# Patient Record
Sex: Male | Born: 1985 | Race: Black or African American | Hispanic: No | Marital: Single | Smoking: Current every day smoker
Health system: Southern US, Community
[De-identification: ages and names within clinical notes are randomized; demographics above are authoritative.]

## PROBLEM LIST (undated history)

## (undated) DIAGNOSIS — J45909 Unspecified asthma, uncomplicated: Secondary | ICD-10-CM

---

## 2012-12-22 ENCOUNTER — Emergency Department (HOSPITAL_COMMUNITY)
Admission: EM | Admit: 2012-12-22 | Discharge: 2012-12-22 | Disposition: A | Discharge status: Home / Self Care | Payer: BC Managed Care – PPO | Attending: Emergency Medicine | Admitting: Emergency Medicine

## 2012-12-22 ENCOUNTER — Emergency Department (HOSPITAL_COMMUNITY): Payer: BC Managed Care – PPO

## 2012-12-22 ENCOUNTER — Encounter (HOSPITAL_COMMUNITY): Payer: Self-pay | Admitting: Adult Health

## 2012-12-22 DIAGNOSIS — Y990 Civilian activity done for income or pay: Secondary | ICD-10-CM | POA: Insufficient documentation

## 2012-12-22 DIAGNOSIS — S298XXA Other specified injuries of thorax, initial encounter: Secondary | ICD-10-CM | POA: Insufficient documentation

## 2012-12-22 DIAGNOSIS — R0789 Other chest pain: Secondary | ICD-10-CM

## 2012-12-22 DIAGNOSIS — J45909 Unspecified asthma, uncomplicated: Secondary | ICD-10-CM | POA: Insufficient documentation

## 2012-12-22 DIAGNOSIS — Y9389 Activity, other specified: Secondary | ICD-10-CM | POA: Insufficient documentation

## 2012-12-22 DIAGNOSIS — Y9289 Other specified places as the place of occurrence of the external cause: Secondary | ICD-10-CM | POA: Insufficient documentation

## 2012-12-22 DIAGNOSIS — F17200 Nicotine dependence, unspecified, uncomplicated: Secondary | ICD-10-CM | POA: Insufficient documentation

## 2012-12-22 DIAGNOSIS — X503XXA Overexertion from repetitive movements, initial encounter: Secondary | ICD-10-CM | POA: Insufficient documentation

## 2012-12-22 HISTORY — DX: Unspecified asthma, uncomplicated: J45.909

## 2012-12-22 LAB — BASIC METABOLIC PANEL
CO2: 25 mEq/L (ref 19–32)
Calcium: 9.5 mg/dL (ref 8.4–10.5)
Creatinine, Ser: 1.02 mg/dL (ref 0.50–1.35)
GFR calc Af Amer: >90 mL/min (ref >90)
GFR calc non Af Amer: >90 mL/min (ref >90)
Sodium: 141 mEq/L (ref 135–145)

## 2012-12-22 LAB — POCT I-STAT TROPONIN I: Troponin i, poc: 0 ng/mL (ref 0.00–0.08)

## 2012-12-22 LAB — CBC
MCH: 30.2 pg (ref 26.0–34.0)
Platelets: 191 10*3/uL (ref 150–400)
RBC: 4.63 MIL/uL (ref 4.22–5.81)
RDW: 12.8 % (ref 11.5–15.5)

## 2012-12-22 MED ORDER — HYDROCODONE-ACETAMINOPHEN 5-325 MG PO TABS: 1.0000 | ORAL_TABLET | Freq: Four times a day (QID) | ORAL | Status: DC | PRN

## 2012-12-22 MED ORDER — HYDROCODONE-ACETAMINOPHEN 5-325 MG PO TABS
1.0000 | ORAL_TABLET | Freq: Once | ORAL | Status: AC
  Administered 2012-12-22: 1 via ORAL
  Filled 2012-12-22: qty 1

## 2012-12-22 MED ORDER — KETOROLAC TROMETHAMINE 60 MG/2ML IM SOLN
60.0000 mg | Freq: Once | INTRAMUSCULAR | Status: AC
  Administered 2012-12-22: 60 mg via INTRAMUSCULAR
  Filled 2012-12-22: qty 2

## 2012-12-22 MED ORDER — IBUPROFEN 800 MG PO TABS: 800.0000 mg | ORAL_TABLET | Freq: Three times a day (TID) | ORAL | Status: DC | PRN

## 2012-12-22 NOTE — ED Notes (Signed)
Pt dc to home.   Pt states understanding to dc instructions.  Pt ambulatory to exit without difficulty.  Pt denies need for w/c. 

## 2012-12-22 NOTE — ED Notes (Addendum)
Presents with sternal chest pain that began Monday night while working at [employer], Chest pain is described as tightness and worse with deep inspiration and touch. Lying down makes pain better, exertion and movement makes pain worse. Pt lifts weights 4 times per week. Denies nausea, denies dizziness. Reports weakness with Chest pain.  Bilateral breath sounds clear. Father died of MI at 3.

## 2012-12-22 NOTE — ED Provider Notes (Signed)
History     CSN: [id number]  Arrival date & time 12/22/12  1719   First MD Initiated Contact with Patient 12/22/12 1827      Chief Complaint  Patient presents with  . Chest Pain    (Consider location/radiation/quality/duration/timing/severity/associated sxs/prior treatment) HPI [name] [name] is a 27 year old male with a past history of asthma who presents to the ED for chest pain. The pain began Monday evening while he was at work. The pain is sharp and started in his right chest upon lifting a keg. He reports the pain is constant and at worse is 8/10. It radiates to his left shoulder blade. He took a Tylenol, which provided little relief. He did not work yesterday or today due to the pain. He states the pain is now also substernal and along the left sternal border since this morning. He denies shoulder pain. He states the pain is worse on exertion, with deep inspiration, and has had some DOE today. He denies palpitations, diaphoresis, and SOB at rest. He denies recent travel, leg pain or edema in his extremitites. He reports working out and lifting 4x/week. He states he does cleans, presses, and other movements that involve the chest and shoulders. He reports he did workout Monday morning and that this pain feels similar to what he had in the past when he had a chest muscle strain. He denies any direct trauma to the chest and shoulders.  Past Medical History  Diagnosis Date  . Asthma     History reviewed. No pertinent past surgical history.  History reviewed. No pertinent family history.  History  Substance Use Topics  . Smoking status: Current Every Day Smoker    Types: Cigarettes  . Smokeless tobacco: Not on file  . Alcohol Use: No      Review of Systems All other systems negative except as documented in the HPI. All pertinent positives and negatives as reviewed in the HPI.  Allergies  Review of patient's allergies indicates no known allergies.  Home Medications    Current Outpatient Rx  Name  Route  Sig  Dispense  Refill  . acetaminophen (TYLENOL) 500 MG tablet   Oral   Take 1,000 mg by mouth every 6 (six) hours as needed for pain.           BP 141/120  Pulse 80  Temp(Src) 98.3 F (36.8 C) (Oral)  Resp 16  SpO2 98%  Physical Exam  Constitutional: He appears well-developed and well-nourished.  HENT:  Head: Normocephalic and atraumatic.  Neck: Normal range of motion. Neck supple.  Cardiovascular: Normal rate, regular rhythm and normal heart sounds.   Pulmonary/Chest: Effort normal and breath sounds normal.  Musculoskeletal:       Right shoulder: He exhibits normal range of motion.       Left shoulder: He exhibits normal range of motion.       Arms:   ED Course  Procedures (including critical care time)  Labs Reviewed  BASIC METABOLIC PANEL - Abnormal; Notable for the following:    Glucose, Bld 122 (*)    All other components within normal limits  CBC  POCT I-STAT TROPONIN I   Dg Chest 2 View  12/22/2012  *RADIOLOGY REPORT*  Clinical Data: Chest pain.  Smoker.  CHEST - 2 VIEW  Comparison: None.  Findings: Lungs are clear.  Heart size is normal.  No pneumothorax or pleural fluid.  IMPRESSION: Negative chest.   Original Report Authenticated By: Holley Dexter, M.D.  The patient has a chest wall injury from lifting the keg at work. The patient will be treated for this. The patient is PERC negative. He has no cardiac risk factors other than smoking. The patient is advised of his BP. Told to follow up with a primary doctor. Told to return here as needed.   MDM  MDM Reviewed: vitals and nursing note Interpretation: labs, ECG and x-ray    Date: 12/22/2012  Rate: 75  Rhythm: normal sinus rhythm  QRS Axis: normal  Intervals: normal  ST/T Wave abnormalities: normal  Conduction Disutrbances:none  Narrative Interpretation:   Old EKG Reviewed: none available           Carlyle Dolly, PA-C 12/22/12 1959

## 2012-12-23 NOTE — ED Provider Notes (Signed)
Medical screening examination/treatment/procedure(s) were performed by non-physician practitioner and as supervising physician I was immediately available for consultation/collaboration.   Joya Gaskins, MD 12/23/12 [phone]

## 2014-03-23 ENCOUNTER — Emergency Department (HOSPITAL_COMMUNITY)
Admission: EM | Admit: 2014-03-23 | Discharge: 2014-03-24 | Disposition: A | Discharge status: Home / Self Care | Payer: BC Managed Care – PPO | Attending: Emergency Medicine | Admitting: Emergency Medicine

## 2014-03-23 DIAGNOSIS — Z791 Long term (current) use of non-steroidal anti-inflammatories (NSAID): Secondary | ICD-10-CM | POA: Insufficient documentation

## 2014-03-23 DIAGNOSIS — F17200 Nicotine dependence, unspecified, uncomplicated: Secondary | ICD-10-CM | POA: Insufficient documentation

## 2014-03-23 DIAGNOSIS — Z79899 Other long term (current) drug therapy: Secondary | ICD-10-CM | POA: Insufficient documentation

## 2014-03-23 DIAGNOSIS — J45909 Unspecified asthma, uncomplicated: Secondary | ICD-10-CM | POA: Insufficient documentation

## 2014-03-23 DIAGNOSIS — L259 Unspecified contact dermatitis, unspecified cause: Secondary | ICD-10-CM | POA: Insufficient documentation

## 2014-03-24 ENCOUNTER — Encounter (HOSPITAL_COMMUNITY): Payer: Self-pay | Admitting: Emergency Medicine

## 2014-03-24 MED ORDER — DEXAMETHASONE SODIUM PHOSPHATE 10 MG/ML IJ SOLN
10.0000 mg | Freq: Once | INTRAMUSCULAR | Status: AC
  Administered 2014-03-24: 10 mg via INTRAMUSCULAR
  Filled 2014-03-24: qty 1

## 2014-03-24 MED ORDER — PERMETHRIN 5 % EX CREA: TOPICAL_CREAM | CUTANEOUS | Status: DC

## 2014-03-24 MED ORDER — HYDROXYZINE HCL 25 MG PO TABS: 25.0000 mg | ORAL_TABLET | Freq: Four times a day (QID) | ORAL | Status: DC

## 2014-03-24 MED ORDER — DIPHENHYDRAMINE HCL 25 MG PO TABS: 25.0000 mg | ORAL_TABLET | Freq: Four times a day (QID) | ORAL | Status: DC | PRN

## 2014-03-24 NOTE — ED Notes (Signed)
Patient with rash to his lower legs and arms.  He has tried cortisone w/o relief. His daughter has same sx.  No one else at home has rash.  No new exposures/soaps.

## 2014-03-24 NOTE — Progress Notes (Signed)
  CARE MANAGEMENT ED NOTE 03/24/2014  Patient:  [name] [name]   Account Number:  [id number]  Date Initiated:  03/24/2014  Documentation initiated by:  Ferdinand CavaSCHETTINO,ANDREA  Subjective/Objective Assessment:   28  yo male contacted this CM due to inability to afford discharge prescriptions     Subjective/Objective Assessment Detail:     Action/Plan:   Patient will fill discharge prescription with MATCH assist   Action/Plan Detail:   Anticipated DC Date:       Status Recommendation to Physician:   Result of Recommendation:  Agreed    DC Planning Services  [facility]    Choice offered to / List presented to:            Status of service:  Completed, signed off  ED Comments:   ED Comments Detail:  This CM received phone call from the patient stated [facility] that the permethrin cream will be $76.00. He stated that he did have BCBS last year through the affordable care act but he was unable to afford the premium and had to drop the coverage, therefore he is currently uninsured. This CM then discussed [facility], that it would reduce the copay to $3 but can only be used once in a year. The patient stated that [facility] because he will not be able to afford the medication otherwise. This CM then entered the information in the system and faxed the [facility] to the [facility], [facility] [address]. Received confirmation that fax received.

## 2014-03-24 NOTE — Discharge Instructions (Signed)
Your treatment today is covering you for both scabies and poison ivy/poison oak. Recommend you take Benadryl as needed for itching. Take Atarax as prescribed as well. For poison ivy/oak, recommend Zanfel wash. Also refrain from itching as this may spread the rash to other areas. For scabies coverage, use permethrin as prescribed. Repeat use in one week if symptoms persist.  Scabies Scabies are small bugs (mites) that burrow under the skin and cause red bumps and severe itching. These bugs can only be seen with a microscope. Scabies are highly contagious. They can spread easily from person to person by direct contact. They are also spread through sharing clothing or linens that have the scabies mites living in them. It is not unusual for an entire family to become infected through shared towels, clothing, or bedding.  HOME CARE INSTRUCTIONS   Your caregiver may prescribe a cream or lotion to kill the mites. If cream is prescribed, massage the cream into the entire body from the neck to the bottom of both feet. Also massage the cream into the scalp and face if your child is less than 26 year old. Avoid the eyes and mouth. Do not wash your hands after application.  Leave the cream on for 8 to 12 hours. Your child should bathe or shower after the 8 to 12 hour application period. Sometimes it is helpful to apply the cream to your child right before bedtime.  One treatment is usually effective and will eliminate approximately 95% of infestations. For severe cases, your caregiver may decide to repeat the treatment in 1 week. Everyone in your household should be treated with one application of the cream.  New rashes or burrows should not appear within 24 to 48 hours after successful treatment. However, the itching and rash may last for 2 to 4 weeks after successful treatment. Your caregiver may prescribe a medicine to help with the itching or to help the rash go away more quickly.  Scabies can live on clothing  or linens for up to 3 days. All of your child's recently used clothing, towels, stuffed toys, and bed linens should be washed in hot water and then dried in a dryer for at least 20 minutes on high heat. Items that cannot be washed should be enclosed in a plastic bag for at least 3 days.  To help relieve itching, bathe your child in a cool bath or apply cool washcloths to the affected areas.  Your child may return to school after treatment with the prescribed cream. SEEK MEDICAL CARE IF:   The itching persists longer than 4 weeks after treatment.  The rash spreads or becomes infected. Signs of infection include red blisters or yellow-tan crust. Document Released: 09/15/2005 Document Revised: 12/08/2011 Document Reviewed: 01/24/2009 [facility] Patient Information 2015 [place], [place]. This information is not intended to replace advice given to you by your health care provider. Make sure you discuss any questions you have with your health care provider.

## 2014-03-31 NOTE — ED Provider Notes (Signed)
CSN: [id number]     Arrival date & time 03/23/14  2320 History   First MD Initiated Contact with Patient 03/23/14 2350     Chief Complaint  Patient presents with  . Rash    (Consider location/radiation/quality/duration/timing/severity/associated sxs/prior Treatment) HPI Comments: Patient is a 28 year old male with no significant past medical history who presents to the emergency department for a rash x2 days. Patient states that rash began on his lower legs and has begun to spread to his bilateral arms. Rash intermittently weeps serous fluid. Patient has tried some topical hydrocortisone without relief. Rash is itching and uncomfortable. He denies any new or recent travel, new ingestions, new soaps/lotions, or pet contact. Patient states daughter is the only one at home with similar symptoms. No associated fever, lip swelling, tongue swelling, trouble swallowing, SOB, cough, N/V, abdominal pain, or weakness.  Patient is a 28 y.o. male presenting with rash. The history is provided by the patient. No language interpreter was used.  Rash   Past Medical History  Diagnosis Date  . Asthma    History reviewed. No pertinent past surgical history. No family history on file. History  Substance Use Topics  . Smoking status: Current Every Day Smoker    Types: Cigarettes  . Smokeless tobacco: Not on file  . Alcohol Use: No    Review of Systems  Skin: Positive for rash.  All other systems reviewed and are negative.    Allergies  Review of patient's allergies indicates no known allergies.  Home Medications   Prior to Admission medications   Medication Sig Start Date End Date Taking? Authorizing Provider  acetaminophen (TYLENOL) 500 MG tablet Take 1,000 mg by mouth every 6 (six) hours as needed for pain.    Historical Provider, MD  diphenhydrAMINE (BENADRYL) 25 MG tablet Take 1 tablet (25 mg total) by mouth every 6 (six) hours as needed for itching (Rash). 03/24/14   Antonietta Breach, PA-C   HYDROcodone-acetaminophen (NORCO/VICODIN) 5-325 MG per tablet Take 1 tablet by mouth every 6 (six) hours as needed for pain. 12/22/12   Resa Miner Lawyer, PA-C  hydrOXYzine (ATARAX/VISTARIL) 25 MG tablet Take 1 tablet (25 mg total) by mouth every 6 (six) hours. 03/24/14   Antonietta Breach, PA-C  ibuprofen (ADVIL,MOTRIN) 800 MG tablet Take 1 tablet (800 mg total) by mouth every 8 (eight) hours as needed for pain. 12/22/12   Brent General, PA-C  permethrin (ELIMITE) 5 % cream Apply to entire body other than face - let sit for 14 hours then wash off, may repeat in 1 week if still having symptoms 03/24/14   Antonietta Breach, PA-C   BP 122/72  Pulse 95  Temp(Src) 98.4 F (36.9 C) (Oral)  Resp 20  Wt 209 lb (94.802 kg)  SpO2 95%  Physical Exam  Nursing note and vitals reviewed. Constitutional: He is oriented to person, place, and time. He appears well-developed and well-nourished. No distress.  Nontoxic/nonseptic appearing  HENT:  Head: Normocephalic and atraumatic.  Mouth/Throat: Oropharynx is clear and moist. No oropharyngeal exudate.  Oropharynx clear. Patient tolerating secretions without difficulty or drooling. No oral lesions.  Eyes: Conjunctivae and EOM are normal. No scleral icterus.  Neck: Normal range of motion.  No nuchal rigidity or meningismus  Pulmonary/Chest: Effort normal. No respiratory distress. He has no wheezes.  Musculoskeletal: Normal range of motion.  Neurological: He is alert and oriented to person, place, and time.  GCS 15. Patient moves extremities without ataxia.  Skin: Skin is warm and dry.  Rash noted. He is not diaphoretic. No erythema. No pallor.  Rash appreciated to b/l limbs, but primarily on b/l lower extremities. Rash pruritic, planar and mildly erythematous with scattered vesicles. No active weeping. No bullae. No skin peeling. Negative Nikolsky sign.  Psychiatric: He has a normal mood and affect. His behavior is normal.    ED Course  Procedures (including  critical care time) Labs Review Labs Reviewed - No data to display  Imaging Review No results found.   EKG Interpretation None      MDM   Final diagnoses:  Contact dermatitis    28 year old male presents for rash to bilateral limbs, primarily lower extremities. Daughter with same rash x2 days. No angioedema or evidence of airway compromise. Patient hemodynamically stable and afebrile. Rash not concerning for SJS, erythema multiforme major, or erythema multiforme minor. Negative Nikolsky sign.  Rash concerning for scabies, however, also resembles contact dermatitis possibly of plant origin. Patient treated in ED with Decadron. He will be discharged with Elimite as well as Hydroxyzine and Benadryl for symptom management. Zanfel also recommended for coverage of poison oak/ivy. PCP followup advised and return precautions discussed. Patient agreeable to plan with no unaddressed concerns.   Filed Vitals:   03/23/14 2357  BP: 122/72  Pulse: 95  Temp: 98.4 F (36.9 C)  TempSrc: Oral  Resp: 20  Weight: 209 lb (94.802 kg)  SpO2: 95%       Antonietta Breach, PA-C 03/31/14 2103

## 2014-04-01 NOTE — ED Provider Notes (Signed)
Medical screening examination/treatment/procedure(s) were performed by non-physician practitioner and as supervising physician I was immediately available for consultation/collaboration.   Shanna CiscoMegan E Docherty, MD 04/01/14 0000

## 2014-04-24 IMAGING — CR DG CHEST 2V
2 series · 2 of 2 positions shown · non-contrast
Comparison: None.

CLINICAL DATA: Chest pain.  Smoker.

CHEST - 2 VIEW

[w chest pa]
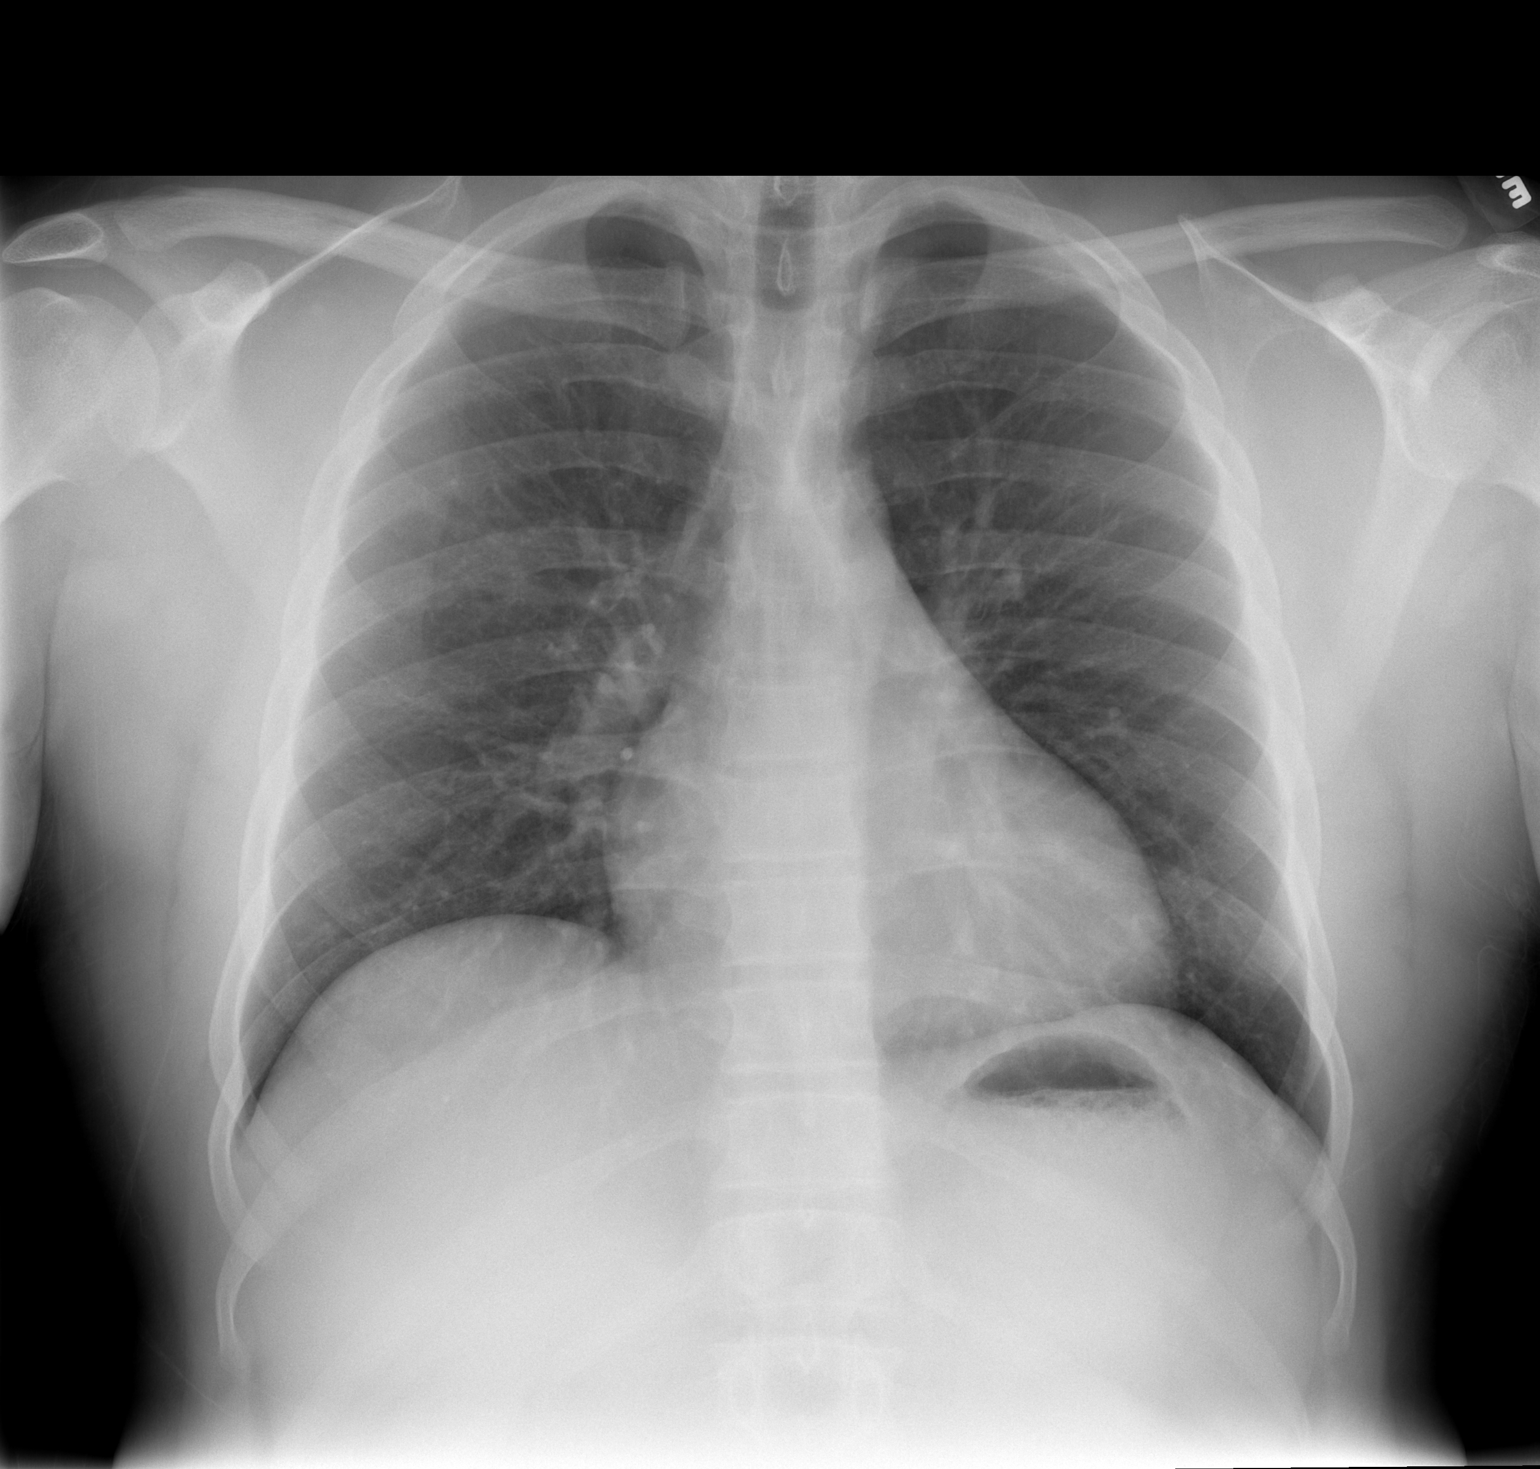

[w chest lat]
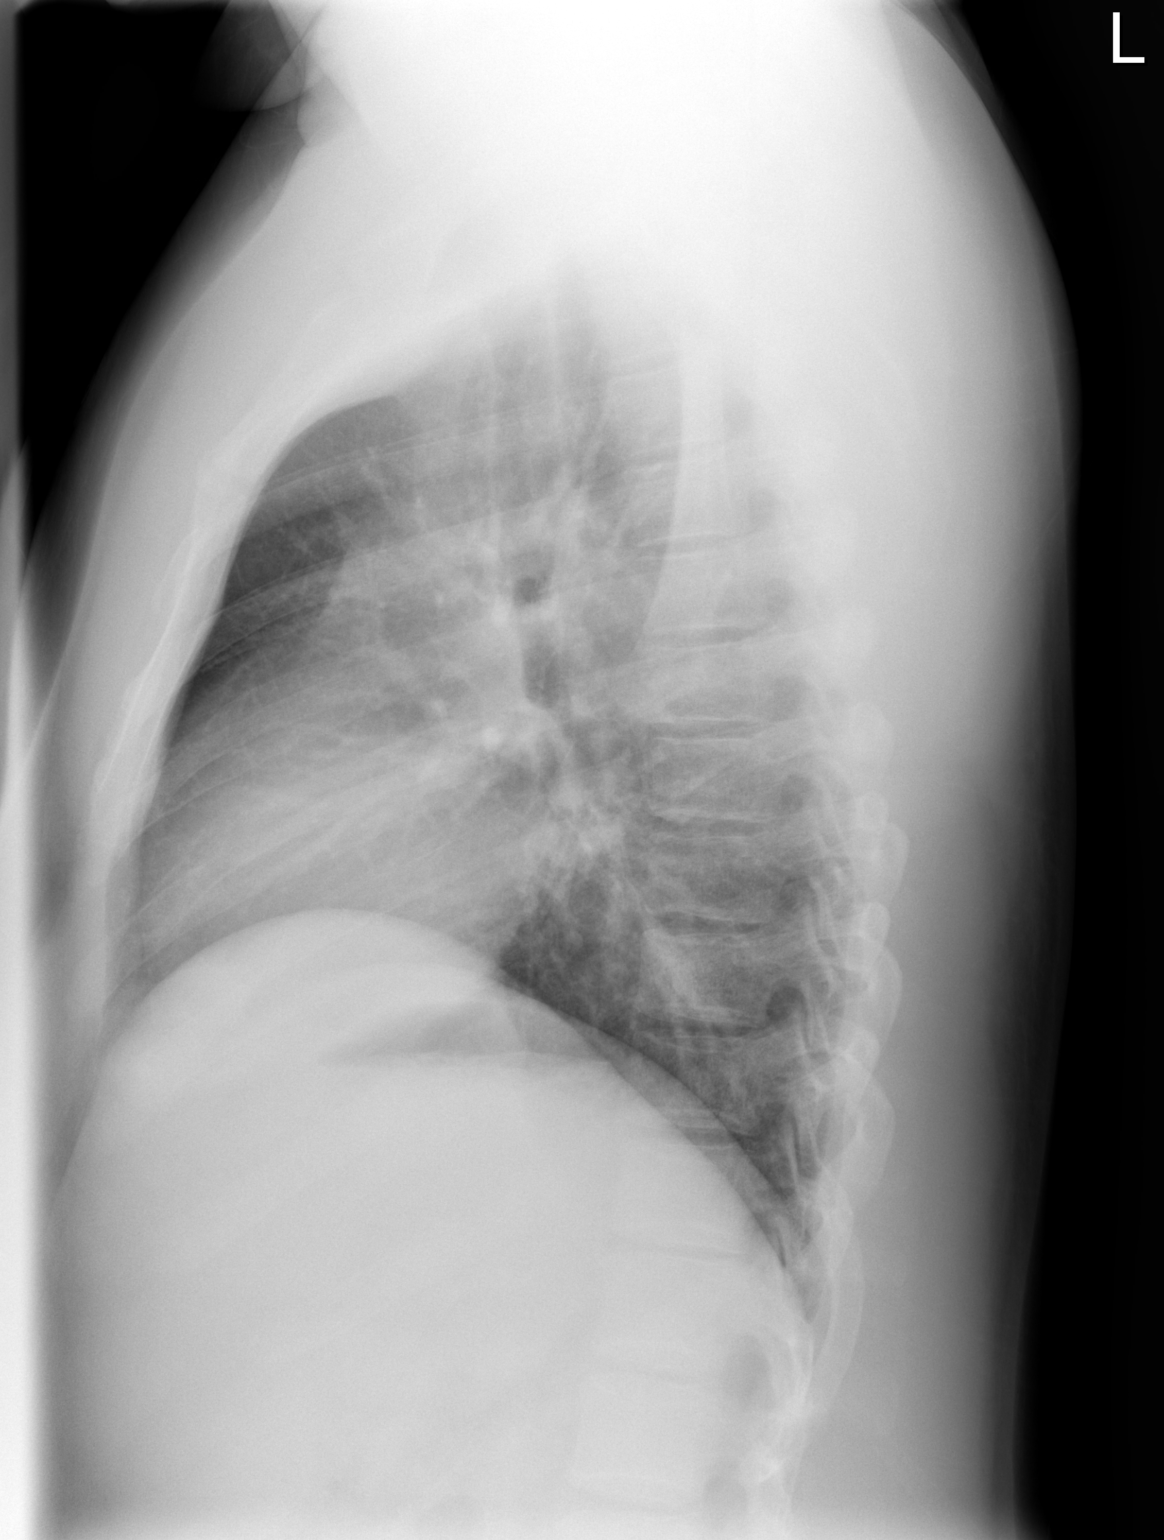

[2 of 2 positions shown; findings below may reference images not displayed]

FINDINGS: Lungs are clear.  Heart size is normal.  No pneumothorax
or pleural fluid.
IMPRESSION: Negative chest.

## 2014-08-28 ENCOUNTER — Encounter (HOSPITAL_COMMUNITY): Payer: Self-pay | Admitting: Family Medicine

## 2014-08-28 ENCOUNTER — Emergency Department (HOSPITAL_COMMUNITY)
Admission: EM | Admit: 2014-08-28 | Discharge: 2014-08-28 | Disposition: A | Discharge status: Home / Self Care | Payer: BC Managed Care – PPO | Attending: Emergency Medicine | Admitting: Emergency Medicine

## 2014-08-28 DIAGNOSIS — J45909 Unspecified asthma, uncomplicated: Secondary | ICD-10-CM | POA: Insufficient documentation

## 2014-08-28 DIAGNOSIS — Y939 Activity, unspecified: Secondary | ICD-10-CM | POA: Insufficient documentation

## 2014-08-28 DIAGNOSIS — R233 Spontaneous ecchymoses: Secondary | ICD-10-CM

## 2014-08-28 DIAGNOSIS — R112 Nausea with vomiting, unspecified: Secondary | ICD-10-CM | POA: Insufficient documentation

## 2014-08-28 DIAGNOSIS — S0512XA Contusion of eyeball and orbital tissues, left eye, initial encounter: Secondary | ICD-10-CM | POA: Insufficient documentation

## 2014-08-28 DIAGNOSIS — Z720 Tobacco use: Secondary | ICD-10-CM | POA: Insufficient documentation

## 2014-08-28 DIAGNOSIS — Z79899 Other long term (current) drug therapy: Secondary | ICD-10-CM | POA: Insufficient documentation

## 2014-08-28 DIAGNOSIS — X58XXXA Exposure to other specified factors, initial encounter: Secondary | ICD-10-CM | POA: Insufficient documentation

## 2014-08-28 DIAGNOSIS — S0511XA Contusion of eyeball and orbital tissues, right eye, initial encounter: Secondary | ICD-10-CM | POA: Insufficient documentation

## 2014-08-28 DIAGNOSIS — Y929 Unspecified place or not applicable: Secondary | ICD-10-CM | POA: Insufficient documentation

## 2014-08-28 DIAGNOSIS — H1133 Conjunctival hemorrhage, bilateral: Secondary | ICD-10-CM | POA: Insufficient documentation

## 2014-08-28 DIAGNOSIS — Y999 Unspecified external cause status: Secondary | ICD-10-CM | POA: Insufficient documentation

## 2014-08-28 NOTE — ED Provider Notes (Signed)
CSN: [id number]     Arrival date & time 08/28/14  1009 History   First MD Initiated Contact with Patient 08/28/14 1113     Chief Complaint  Patient presents with  . Eye Problem     (Consider location/radiation/quality/duration/timing/severity/associated sxs/prior Treatment) Patient is a 28 y.o. male presenting with eye problem. The history is provided by the patient.  Eye Problem Associated symptoms: nausea and vomiting   Associated symptoms: no headaches   pt w recent nvd illness yesterday, was retching violently, then noted red areas bil eyes and tiny little red areas in skin of face and became concerned. No hx same. Denies any anticoagulant use. No other abnormal bruising or bleeding. No eye pain or change in vision. No headaches. Had several episodes vomiting and diarrhea yesterday, not bloody. Today gi symptoms resolved. No nvd. No abd pain. No fever or chills.       Past Medical History  Diagnosis Date  . Asthma    History reviewed. No pertinent past surgical history. History reviewed. No pertinent family history. History  Substance Use Topics  . Smoking status: Current Every Day Smoker    Types: Cigarettes  . Smokeless tobacco: Not on file  . Alcohol Use: No    Review of Systems  Constitutional: Negative for fever.  HENT: Negative for sore throat.   Eyes: Negative for pain and visual disturbance.  Respiratory: Negative for shortness of breath.   Cardiovascular: Negative for chest pain.  Gastrointestinal: Positive for nausea, vomiting and diarrhea. Negative for abdominal pain.  Genitourinary: Negative for hematuria and flank pain.  Musculoskeletal: Negative for back pain and neck pain.  Skin: Negative for wound.  Neurological: Negative for headaches.  Hematological: Does not bruise/bleed easily.  Psychiatric/Behavioral: Negative for confusion.      Allergies  Review of patient's allergies indicates no known allergies.  Home Medications   Prior to  Admission medications   Medication Sig Start Date End Date Taking? Authorizing Provider  acetaminophen (TYLENOL) 500 MG tablet Take 1,000 mg by mouth every 6 (six) hours as needed for pain.   Yes Historical Provider, MD  diphenhydrAMINE (BENADRYL) 25 MG tablet Take 1 tablet (25 mg total) by mouth every 6 (six) hours as needed for itching (Rash). Patient not taking: Reported on 08/28/2014 03/24/14   Antony MaduraKelly Humes, PA-C  HYDROcodone-acetaminophen (NORCO/VICODIN) 5-325 MG per tablet Take 1 tablet by mouth every 6 (six) hours as needed for pain. Patient not taking: Reported on 08/28/2014 12/22/12   Jamesetta Orleanshristopher W Lawyer, PA-C  hydrOXYzine (ATARAX/VISTARIL) 25 MG tablet Take 1 tablet (25 mg total) by mouth every 6 (six) hours. Patient not taking: Reported on 08/28/2014 03/24/14   Antony MaduraKelly Humes, PA-C  ibuprofen (ADVIL,MOTRIN) 800 MG tablet Take 1 tablet (800 mg total) by mouth every 8 (eight) hours as needed for pain. Patient not taking: Reported on 08/28/2014 12/22/12   Jamesetta Orleanshristopher W Lawyer, PA-C  permethrin (ELIMITE) 5 % cream Apply to entire body other than face - let sit for 14 hours then wash off, may repeat in 1 week if still having symptoms Patient not taking: Reported on 08/28/2014 03/24/14   Antony MaduraKelly Humes, PA-C   BP 123/83 mmHg  Pulse 87  Temp(Src) 98.6 F (37 C)  Resp 18  SpO2 98% Physical Exam  Constitutional: He is oriented to person, place, and time. He appears well-developed and well-nourished. No distress.  HENT:  Mouth/Throat: Oropharynx is clear and moist.  Eyes: EOM are normal. Pupils are equal, round, and reactive to light. No  scleral icterus.  bil subconj hemorrhage  Neck: Neck supple. No tracheal deviation present.  Cardiovascular: Normal rate, regular rhythm, normal heart sounds and intact distal pulses.   Pulmonary/Chest: Effort normal and breath sounds normal. No accessory muscle usage. No respiratory distress.  Abdominal: Soft. Bowel sounds are normal. He exhibits no distension.  There is no tenderness.  Musculoskeletal: Normal range of motion. He exhibits no edema or tenderness.  Neurological: He is alert and oriented to person, place, and time.  Skin: Skin is warm and dry. He is not diaphoretic.  Few petechia lesions to face  Psychiatric: He has a normal mood and affect.  Nursing note and vitals reviewed.   ED Course  Procedures (including critical care time) Labs Review      MDM  Reviewed nursing notes and prior charts for additional history.   Exam c/w retching/vomiting w nvd illness.  Petechia about face and bil subjconj hem.  No anticoag use.  No other abn bruising or bleeding.  Nausea resolved. No vomiting or diarrhea today. Tolerating po fluids today.  Pt appears stable for d/c.      Suzi RootsKevin E Tyshae Stair, MD 08/28/14 1134

## 2014-08-28 NOTE — ED Notes (Signed)
Per pt sts yesterday he had a stomach virus and was vomiting. sts he feels better today but now has blood in right eye that has gotten worse and swelling in face and red dots on face. Denies any allergies.

## 2014-08-28 NOTE — Discharge Instructions (Signed)
It was our pleasure to provide your ER care today - we hope that you feel better.  Return to ER if worse, new symptoms, persistent vomiting, severe headache, fevers, eye pain or change in vision, other concern.       Subconjunctival Hemorrhage A subconjunctival hemorrhage is a bright red patch covering a portion of the white of the eye. The white part of the eye is called the sclera, and it is covered by a thin membrane called the conjunctiva. This membrane is clear, except for tiny blood vessels that you can see with the naked eye. When your eye is irritated or inflamed and becomes red, it is because the vessels in the conjunctiva are swollen. Sometimes, a blood vessel in the conjunctiva can break and bleed. When this occurs, the blood builds up between the conjunctiva and the sclera, and spreads out to create a red area. The red spot may be very small at first. It may then spread to cover a larger part of the surface of the eye, or even all of the visible white part of the eye. In almost all cases, the blood will go away and the eye will become white again. Before completely dissolving, however, the red area may spread. It may also become brownish-yellow in color before going away. If a lot of blood collects under the conjunctiva, it may look like a bulge on the surface of the eye. This looks scary, but it will also eventually flatten out and go away. Subconjunctival hemorrhages do not cause pain, but if swollen, may cause a feeling of irritation. There is no effect on vision.  CAUSES   The most common cause is mild trauma (rubbing the eye, irritation).  Subconjunctival hemorrhages can happen because of coughing or straining (lifting heavy objects), vomiting, or sneezing.  In some cases, your doctor may want to check your blood pressure. High blood pressure can also cause a subconjunctival hemorrhage.  Severe trauma or blunt injuries.  Diseases that affect blood clotting (hemophilia,  leukemia).  Abnormalities of blood vessels behind the eye (carotid cavernous sinus fistula).  Tumors behind the eye.  Certain drugs (aspirin, Coumadin, heparin).  Recent eye surgery. HOME CARE INSTRUCTIONS   Do not worry about the appearance of your eye. You may continue your usual activities.  Often, follow-up is not necessary. SEEK MEDICAL CARE IF:   Your eye becomes painful.  The bleeding does not disappear within 3 weeks.  Bleeding occurs elsewhere, for example, under the skin, in the mouth, or in the other eye.  You have recurring subconjunctival hemorrhages. SEEK IMMEDIATE MEDICAL CARE IF:   Your vision changes or you have difficulty seeing.  You develop a severe headache, persistent vomiting, confusion, or abnormal drowsiness (lethargy).  Your eye seems to bulge or protrude from the eye socket.  You notice the sudden appearance of bruises or have spontaneous bleeding elsewhere on your body. Document Released: 09/15/2005 Document Revised: 01/30/2014 Document Reviewed: 08/13/2009 [facility] Patient Information 2015 [place], [place]. This information is not intended to replace advice given to you by your health care provider. Make sure you discuss any questions you have with your health care provider.

## 2015-06-14 ENCOUNTER — Emergency Department (HOSPITAL_COMMUNITY): Payer: Self-pay

## 2015-06-14 ENCOUNTER — Emergency Department (HOSPITAL_COMMUNITY)
Admission: EM | Admit: 2015-06-14 | Discharge: 2015-06-14 | Disposition: A | Discharge status: Home / Self Care | Payer: Self-pay | Attending: Emergency Medicine | Admitting: Emergency Medicine

## 2015-06-14 ENCOUNTER — Encounter (HOSPITAL_COMMUNITY): Payer: Self-pay | Admitting: Emergency Medicine

## 2015-06-14 DIAGNOSIS — Y939 Activity, unspecified: Secondary | ICD-10-CM | POA: Insufficient documentation

## 2015-06-14 DIAGNOSIS — Y999 Unspecified external cause status: Secondary | ICD-10-CM | POA: Insufficient documentation

## 2015-06-14 DIAGNOSIS — R0789 Other chest pain: Secondary | ICD-10-CM

## 2015-06-14 DIAGNOSIS — X58XXXA Exposure to other specified factors, initial encounter: Secondary | ICD-10-CM | POA: Insufficient documentation

## 2015-06-14 DIAGNOSIS — S29011A Strain of muscle and tendon of front wall of thorax, initial encounter: Secondary | ICD-10-CM | POA: Insufficient documentation

## 2015-06-14 DIAGNOSIS — J45909 Unspecified asthma, uncomplicated: Secondary | ICD-10-CM | POA: Insufficient documentation

## 2015-06-14 DIAGNOSIS — Y929 Unspecified place or not applicable: Secondary | ICD-10-CM | POA: Insufficient documentation

## 2015-06-14 DIAGNOSIS — Z720 Tobacco use: Secondary | ICD-10-CM | POA: Insufficient documentation

## 2015-06-14 LAB — BASIC METABOLIC PANEL
ANION GAP: 8 (ref 5–15)
BUN: 11 mg/dL (ref 6–20)
CHLORIDE: 108 mmol/L (ref 101–111)
CO2: 24 mmol/L (ref 22–32)
Calcium: 9.5 mg/dL (ref 8.9–10.3)
Creatinine, Ser: 0.94 mg/dL (ref 0.61–1.24)
GFR calc non Af Amer: >60 mL/min (ref >60)
Glucose, Bld: 86 mg/dL (ref 65–99)
POTASSIUM: 3.7 mmol/L (ref 3.5–5.1)
SODIUM: 140 mmol/L (ref 135–145)

## 2015-06-14 LAB — CBC
HEMATOCRIT: 41.1 % (ref 39.0–52.0)
HEMOGLOBIN: 14 g/dL (ref 13.0–17.0)
MCH: 30.3 pg (ref 26.0–34.0)
MCHC: 34.1 g/dL (ref 30.0–36.0)
MCV: 89 fL (ref 78.0–100.0)
Platelets: 193 10*3/uL (ref 150–400)
RBC: 4.62 MIL/uL (ref 4.22–5.81)
RDW: 12.9 % (ref 11.5–15.5)
WBC: 10.7 10*3/uL — AB (ref 4.0–10.5)

## 2015-06-14 LAB — TROPONIN I

## 2015-06-14 LAB — I-STAT TROPONIN, ED: Troponin i, poc: 0 ng/mL (ref 0.00–0.08)

## 2015-06-14 MED ORDER — CYCLOBENZAPRINE HCL 10 MG PO TABS: 10.0000 mg | ORAL_TABLET | Freq: Two times a day (BID) | ORAL | Status: DC | PRN

## 2015-06-14 MED ORDER — NAPROXEN 500 MG PO TABS: 500.0000 mg | ORAL_TABLET | Freq: Two times a day (BID) | ORAL | Status: DC

## 2015-06-14 NOTE — Discharge Instructions (Signed)
Naprosyn for pain. Flexeril for muscle spasms. Alternate ice/heat. Avoid any lifting greater than 2 lb with right arm for 1-2 weeks. Follow up with primary care doctor or urgent care if not improving. Lab work, chest xray all normal.     Chest Wall Pain Chest wall pain is pain in or around the bones and muscles of your chest. It may take up to 6 weeks to get better. It may take longer if you must stay physically active in your work and activities.  CAUSES  Chest wall pain may happen on its own. However, it may be caused by:  A viral illness like the flu.  Injury.  Coughing.  Exercise.  Arthritis.  Fibromyalgia.  Shingles. HOME CARE INSTRUCTIONS   Avoid overtiring physical activity. Try not to strain or perform activities that cause pain. This includes any activities using your chest or your abdominal and side muscles, especially if heavy weights are used.  Put ice on the sore area.  Put ice in a plastic bag.  Place a towel between your skin and the bag.  Leave the ice on for 15-20 minutes per hour while awake for the first 2 days.  Only take over-the-counter or prescription medicines for pain, discomfort, or fever as directed by your caregiver. SEEK IMMEDIATE MEDICAL CARE IF:   Your pain increases, or you are very uncomfortable.  You have a fever.  Your chest pain becomes worse.  You have new, unexplained symptoms.  You have nausea or vomiting.  You feel sweaty or lightheaded.  You have a cough with phlegm (sputum), or you cough up blood. MAKE SURE YOU:   Understand these instructions.  Will watch your condition.  Will get help right away if you are not doing well or get worse. Document Released: 09/15/2005 Document Revised: 12/08/2011 Document Reviewed: 05/12/2011 [facility] Patient Information 2015 [place], [place]. This information is not intended to replace advice given to you by your health care provider. Make sure you discuss any questions you have with  your health care provider.

## 2015-06-14 NOTE — ED Notes (Signed)
NAD at this time. Pt is stable and going home.  

## 2015-06-14 NOTE — ED Provider Notes (Signed)
CSN: [id number]     Arrival date & time 06/14/15  1406 History  This chart was scribed for non-physician practitioner Jaynie Crumble, PA-C, working with Dr. Rubin Payor , by Tanda Rockers, ED Scribe. This patient was seen in room TR04C/TR04C and the patient's care was started at 4:46 PM.   Chief Complaint  Patient presents with  . Chest Pain   The history is provided by the patient. No language interpreter was used.     HPI Comments: [name] [name] is a 29 y.o. male who presents to the Emergency Department complaining of sudden onset, constant, tightness, right sided chest pain that began last night, worsening today. Pt states that he exercised yesterday and believes he strained himself. He mentions that today at work he was lifting heavy boxes when the pain worsened. The pain is mildly exacerbated with deep breathing and movement. No meds PTA. Denies shortness of breath, dizziness, lightheadedness, nausea, vomiting, diaphoresis, pain or swelling in lower extremities, or any other associated symptoms. Pt is current every day smoker. No recent surgery or prolonged travel.    Past Medical History  Diagnosis Date  . Asthma    History reviewed. No pertinent past surgical history. History reviewed. No pertinent family history. Social History  Substance Use Topics  . Smoking status: Current Every Day Smoker -- 0.50 packs/day    Types: Cigarettes  . Smokeless tobacco: None  . Alcohol Use: No    Review of Systems  Constitutional: Negative for diaphoresis.  Respiratory: Negative for shortness of breath.   Cardiovascular: Positive for chest pain (Right sided). Negative for leg swelling.  Gastrointestinal: Negative for nausea and vomiting.  Neurological: Negative for dizziness and light-headedness.   Allergies  Review of patient's allergies indicates no known allergies.  Home Medications   Prior to Admission medications   Medication Sig Start Date End Date Taking? Authorizing  Provider  acetaminophen (TYLENOL) 500 MG tablet Take 1,000 mg by mouth every 6 (six) hours as needed for pain.    Historical Provider, MD  diphenhydrAMINE (BENADRYL) 25 MG tablet Take 1 tablet (25 mg total) by mouth every 6 (six) hours as needed for itching (Rash). Patient not taking: Reported on 08/28/2014 03/24/14   Antony Madura, PA-C  HYDROcodone-acetaminophen (NORCO/VICODIN) 5-325 MG per tablet Take 1 tablet by mouth every 6 (six) hours as needed for pain. Patient not taking: Reported on 08/28/2014 12/22/12   Charlestine Night, PA-C  hydrOXYzine (ATARAX/VISTARIL) 25 MG tablet Take 1 tablet (25 mg total) by mouth every 6 (six) hours. Patient not taking: Reported on 08/28/2014 03/24/14   Antony Madura, PA-C  ibuprofen (ADVIL,MOTRIN) 800 MG tablet Take 1 tablet (800 mg total) by mouth every 8 (eight) hours as needed for pain. Patient not taking: Reported on 08/28/2014 12/22/12   Charlestine Night, PA-C  permethrin (ELIMITE) 5 % cream Apply to entire body other than face - let sit for 14 hours then wash off, may repeat in 1 week if still having symptoms Patient not taking: Reported on 08/28/2014 03/24/14   Antony Madura, PA-C   Triage Vitals: BP 129/72 mmHg  Pulse 68  Temp(Src) 98.9 F (37.2 C) (Oral)  Resp 18  SpO2 97%   Physical Exam  Constitutional: He is oriented to person, place, and time. He appears well-developed and well-nourished. No distress.  HENT:  Head: Normocephalic and atraumatic.  Eyes: Conjunctivae and EOM are normal.  Neck: Neck supple. No tracheal deviation present.  Cardiovascular: Normal rate, regular rhythm, normal heart sounds and intact distal pulses.  Pulmonary/Chest: Effort normal and breath sounds normal. No respiratory distress. He has no wheezes. He has no rales. He exhibits tenderness.  Tenderness to palpation over right pectoralis muscle. Pain with range of motion of the arm, specifically increased pain with abduction and medial deviation against resistance. There  is no bruising or swelling, there is no rash, no deformity to the chest wall  Musculoskeletal: Normal range of motion. He exhibits no edema.  Neurological: He is alert and oriented to person, place, and time.  Skin: Skin is warm and dry.  Psychiatric: He has a normal mood and affect. His behavior is normal.  Nursing note and vitals reviewed.   ED Course  Procedures (including critical care time)  DIAGNOSTIC STUDIES: Oxygen Saturation is 97% on RA, normal by my interpretation.    COORDINATION OF CARE: 4:50 PM-Discussed treatment plan which includes OTC NSAIDS and Rx muscle relaxer with pt at bedside and pt agreed to plan.   Labs Review Labs Reviewed  CBC - Abnormal; Notable for the following:    WBC 10.7 (*)    All other components within normal limits  BASIC METABOLIC PANEL  TROPONIN I  I-STAT TROPOININ, ED    Imaging Review Dg Chest 2 View  06/14/2015   CLINICAL DATA:  chest pain right-sided chest pressure since last night  EXAM: CHEST  2 VIEW  COMPARISON:  12/22/2012  FINDINGS: The heart size and mediastinal contours are within normal limits. Both lungs are clear. The visualized skeletal structures are unremarkable.  IMPRESSION: No active cardiopulmonary disease.   Electronically Signed   By: Esperanza Heir M.D.   On: 06/14/2015 14:50   I have personally reviewed and evaluated these images and lab results as part of my medical decision-making.   EKG Interpretation   Date/Time:  Thursday June 14 2015 14:09:36 EDT Ventricular Rate:  71 PR Interval:  140 QRS Duration: 92 QT Interval:  376 QTC Calculation: 408 R Axis:   78 Text Interpretation:  Normal sinus rhythm with sinus arrhythmia Normal ECG  Confirmed by Rubin Payor  MD, Harrold Donath [phone]) on 06/14/2015 4:54:32 PM      MDM   Final diagnoses:  Strain of right pectoralis muscle, initial encounter  Chest wall pain   Patient emergency department complaining of right-sided chest wall pain. Patient states it started  yesterday after lifting some heavy boxes at work and after working out. Patient has pain with movement of the arm and with palpation of the right chest wall. Patient denies any shortness of breath. He denies any dizziness or lightheadedness. No recent travel or surgeries. His vital signs are all within normal. PERC negative, doubt PE. No risk factors for ACS other than being a smoker. Based on his examination and physical exam, this is most likely chest wall pain, most likely from a strained muscle. I will treat him with NSAIDs, naproxen, Flexeril, rest, follow up as needed.  Filed Vitals:   06/14/15 1411  BP: 129/72  Pulse: 68  Temp: 98.9 F (37.2 C)  TempSrc: Oral  Resp: 18  SpO2: 97%   I personally performed the services described in this documentation, which was scribed in my presence. The recorded information has been reviewed and is accurate.    Jaynie Crumble, PA-C 06/14/15 1701  Benjiman Core, MD 06/15/15 0010

## 2015-06-14 NOTE — ED Notes (Signed)
Pt reports right sided chest pressure and pain since last night. States he exercised yesterday and thought he strained himself. TOday while at work pain continued and his boss told him to come to the ED to get checked out. Pt denies SOB, radiation of pain.

## 2016-10-14 IMAGING — DX DG CHEST 2V
2 series · 2 of 2 positions shown · non-contrast
Comparison: 12/22/2012

CLINICAL DATA: chest pain right-sided chest pressure since last
night

EXAM:
CHEST  2 VIEW

[chest pa]
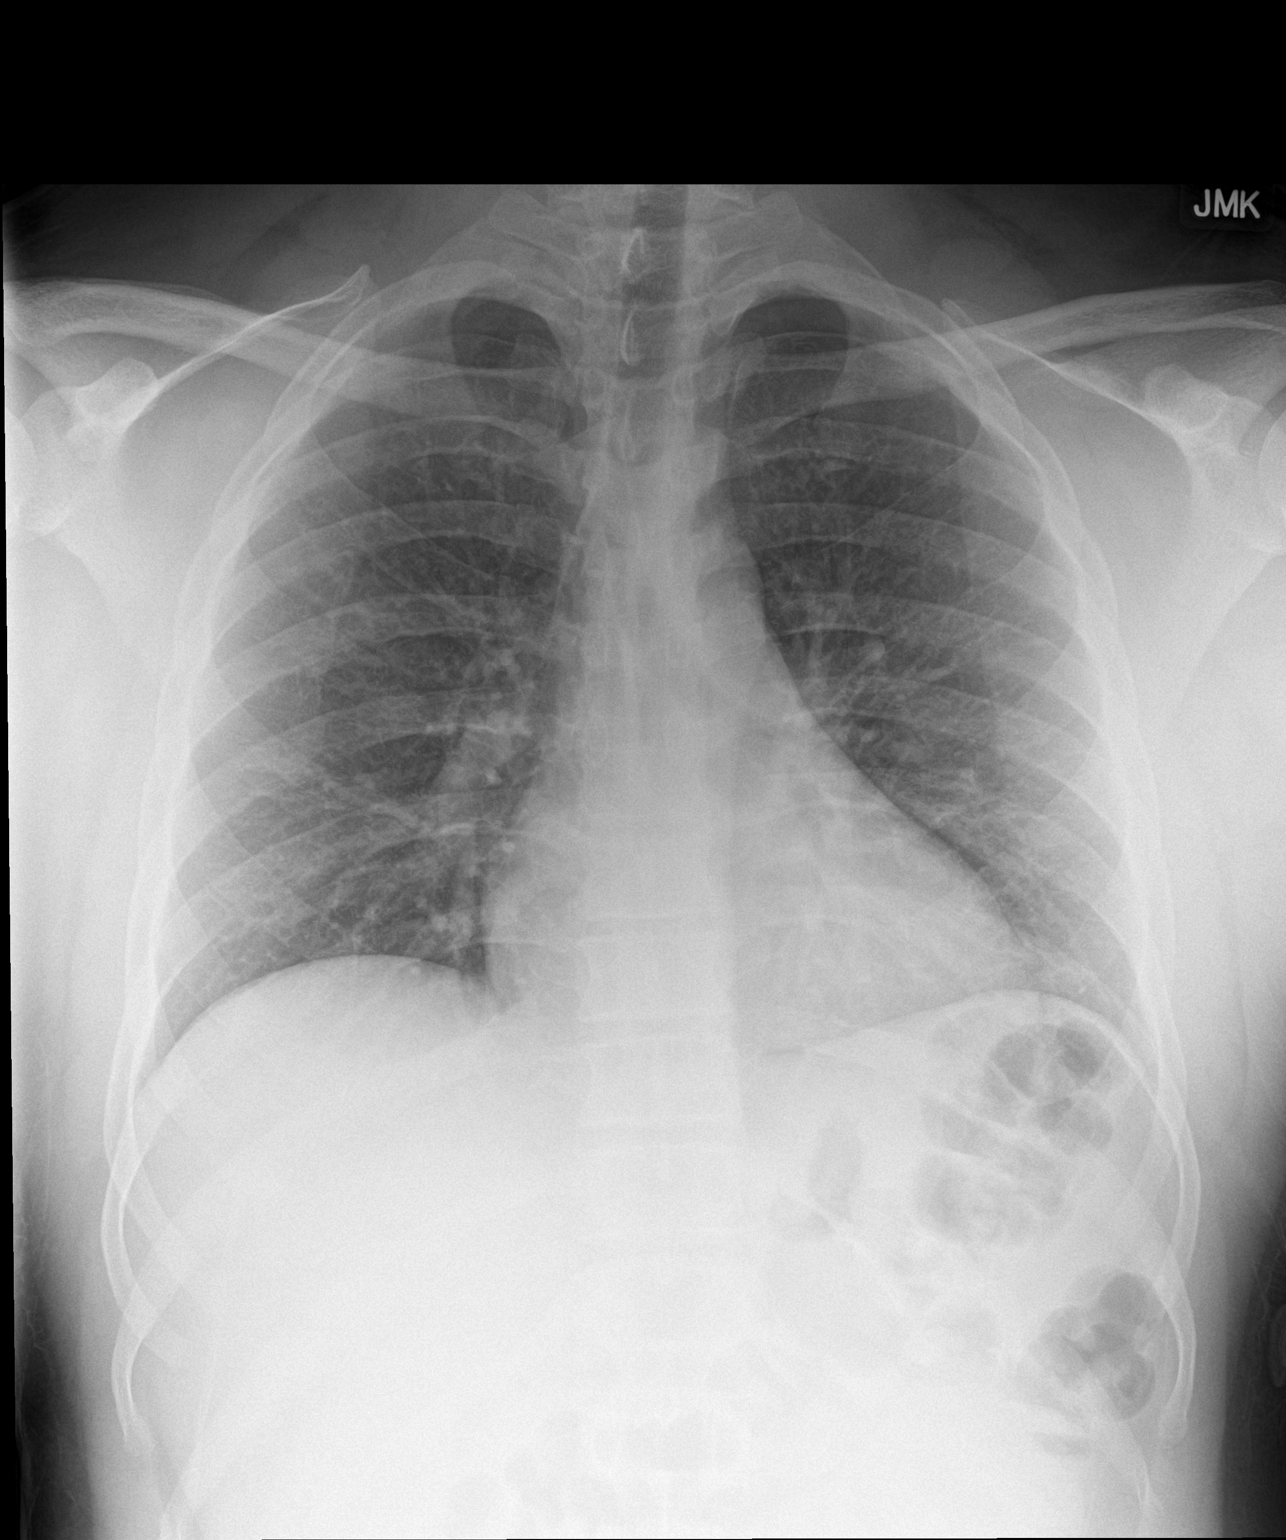

[chest lat]
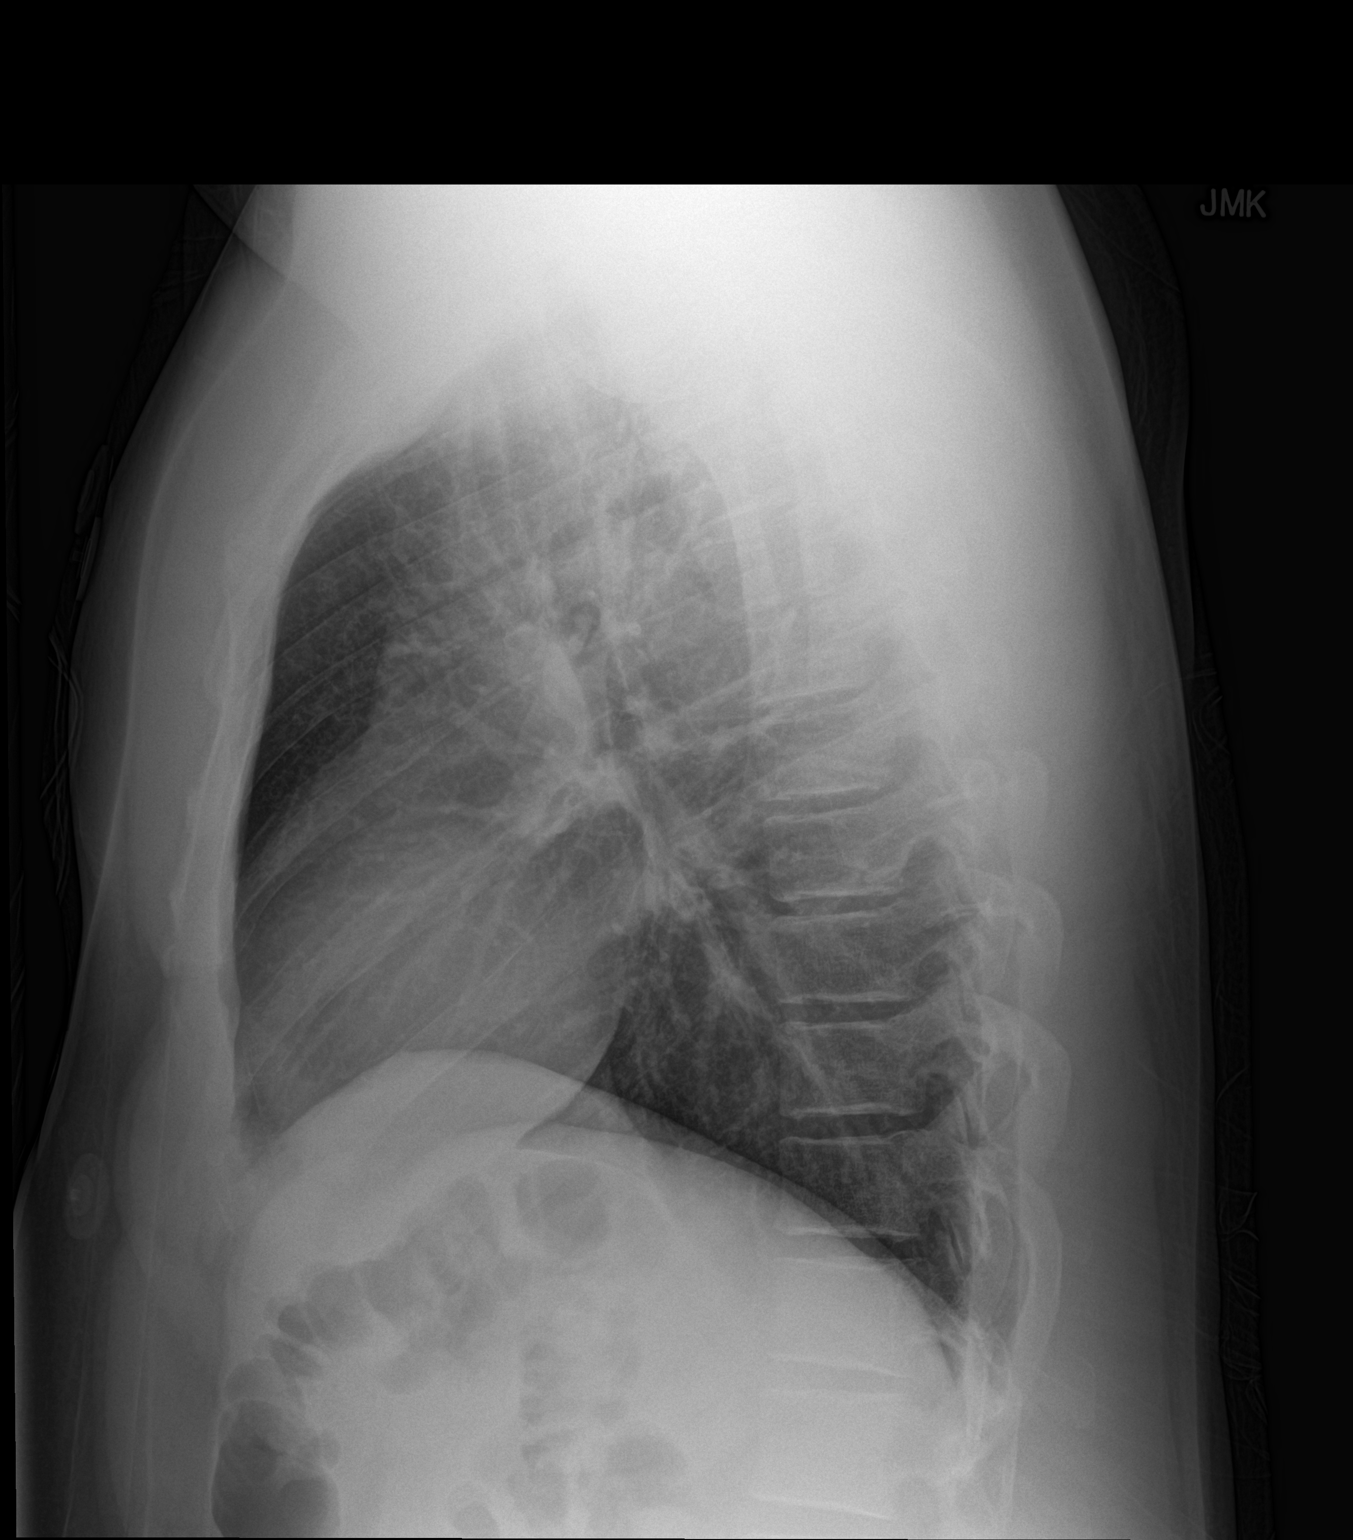

[2 of 2 positions shown; findings below may reference images not displayed]

FINDINGS: The heart size and mediastinal contours are within normal limits.
Both lungs are clear. The visualized skeletal structures are
unremarkable.
IMPRESSION: No active cardiopulmonary disease.

## 2017-02-02 ENCOUNTER — Emergency Department (HOSPITAL_COMMUNITY)
Admission: EM | Admit: 2017-02-02 | Discharge: 2017-02-02 | Disposition: A | Discharge status: Home / Self Care | Payer: Self-pay | Attending: Emergency Medicine | Admitting: Emergency Medicine

## 2017-02-02 ENCOUNTER — Encounter (HOSPITAL_COMMUNITY): Payer: Self-pay | Admitting: Emergency Medicine

## 2017-02-02 DIAGNOSIS — J45909 Unspecified asthma, uncomplicated: Secondary | ICD-10-CM | POA: Insufficient documentation

## 2017-02-02 DIAGNOSIS — R112 Nausea with vomiting, unspecified: Secondary | ICD-10-CM | POA: Insufficient documentation

## 2017-02-02 DIAGNOSIS — R197 Diarrhea, unspecified: Secondary | ICD-10-CM | POA: Insufficient documentation

## 2017-02-02 DIAGNOSIS — R1013 Epigastric pain: Secondary | ICD-10-CM | POA: Insufficient documentation

## 2017-02-02 DIAGNOSIS — F17210 Nicotine dependence, cigarettes, uncomplicated: Secondary | ICD-10-CM | POA: Insufficient documentation

## 2017-02-02 DIAGNOSIS — Z79899 Other long term (current) drug therapy: Secondary | ICD-10-CM | POA: Insufficient documentation

## 2017-02-02 LAB — COMPREHENSIVE METABOLIC PANEL
ALT: 28 U/L (ref 17–63)
AST: 29 U/L (ref 15–41)
Albumin: 4.3 g/dL (ref 3.5–5.0)
Alkaline Phosphatase: 58 U/L (ref 38–126)
Anion gap: 5 (ref 5–15)
BUN: 8 mg/dL (ref 6–20)
CO2: 26 mmol/L (ref 22–32)
Calcium: 9.3 mg/dL (ref 8.9–10.3)
Chloride: 108 mmol/L (ref 101–111)
Creatinine, Ser: 0.83 mg/dL (ref 0.61–1.24)
GFR calc Af Amer: >60 mL/min (ref >60)
GFR calc non Af Amer: >60 mL/min (ref >60)
Glucose, Bld: 103 mg/dL — ABNORMAL HIGH (ref 65–99)
Potassium: 4 mmol/L (ref 3.5–5.1)
Sodium: 139 mmol/L (ref 135–145)
Total Bilirubin: 0.5 mg/dL (ref 0.3–1.2)
Total Protein: 6.9 g/dL (ref 6.5–8.1)

## 2017-02-02 LAB — CBC
HCT: 42.6 % (ref 39.0–52.0)
Hemoglobin: 14.3 g/dL (ref 13.0–17.0)
MCH: 30.3 pg (ref 26.0–34.0)
MCHC: 33.6 g/dL (ref 30.0–36.0)
MCV: 90.3 fL (ref 78.0–100.0)
Platelets: 168 10*3/uL (ref 150–400)
RBC: 4.72 MIL/uL (ref 4.22–5.81)
RDW: 13.2 % (ref 11.5–15.5)
WBC: 7.5 10*3/uL (ref 4.0–10.5)

## 2017-02-02 LAB — LIPASE, BLOOD: Lipase: 50 U/L (ref 11–51)

## 2017-02-02 MED ORDER — ONDANSETRON HCL 4 MG PO TABS: 4.0000 mg | ORAL_TABLET | Freq: Four times a day (QID) | ORAL | 0 refills | Status: DC

## 2017-02-02 MED ORDER — TRAMADOL HCL 50 MG PO TABS
50.0000 mg | ORAL_TABLET | Freq: Once | ORAL | Status: DC
  Filled 2017-02-02: qty 1

## 2017-02-02 MED ORDER — PROMETHAZINE HCL 25 MG PO TABS
25.0000 mg | ORAL_TABLET | Freq: Once | ORAL | Status: DC
  Filled 2017-02-02: qty 1

## 2017-02-02 MED ORDER — GI COCKTAIL ~~LOC~~
30.0000 mL | Freq: Once | ORAL | Status: AC
  Administered 2017-02-02: 30 mL via ORAL
  Filled 2017-02-02: qty 30

## 2017-02-02 MED ORDER — ONDANSETRON 4 MG PO TBDP
4.0000 mg | ORAL_TABLET | Freq: Once | ORAL | Status: AC
  Administered 2017-02-02: 4 mg via ORAL
  Filled 2017-02-02: qty 1

## 2017-02-02 NOTE — ED Triage Notes (Signed)
Pt to ER for epigastric abdominal pain onset last night after eating chinese per patient. Reports nausea with vomiting x2 and diarrhea x1. NAD. A/o x4.

## 2017-02-08 NOTE — ED Provider Notes (Signed)
[facility]-EMERGENCY DEPT Provider Note   CSN: [id number] Arrival date & time: 02/02/17  0745     History   Chief Complaint Chief Complaint  Patient presents with  . Abdominal Pain    HPI [name] [name] is a 31 y.o. male.  HPI   30yM with epigastric pain and n/v. Onset last night. Ate [place] and questions if symptoms are from this. No fever or chills. No blood in stool or emesis. Diarrhea x1. No sick contacts.   Past Medical History:  Diagnosis Date  . Asthma     There are no active problems to display for this patient.   History reviewed. No pertinent surgical history.     Home Medications    Prior to Admission medications   Medication Sig Start Date End Date Taking? Authorizing Provider  cyclobenzaprine (FLEXERIL) 10 MG tablet Take 1 tablet (10 mg total) by mouth 2 (two) times daily as needed for muscle spasms. Patient not taking: Reported on 02/02/2017 06/14/15   Jaynie Crumble, PA-C  diphenhydrAMINE (BENADRYL) 25 MG tablet Take 1 tablet (25 mg total) by mouth every 6 (six) hours as needed for itching (Rash). Patient not taking: Reported on 08/28/2014 03/24/14   Antony Madura, PA-C  HYDROcodone-acetaminophen (NORCO/VICODIN) 5-325 MG per tablet Take 1 tablet by mouth every 6 (six) hours as needed for pain. Patient not taking: Reported on 08/28/2014 12/22/12   Charlestine Night, PA-C  hydrOXYzine (ATARAX/VISTARIL) 25 MG tablet Take 1 tablet (25 mg total) by mouth every 6 (six) hours. Patient not taking: Reported on 08/28/2014 03/24/14   Antony Madura, PA-C  ibuprofen (ADVIL,MOTRIN) 800 MG tablet Take 1 tablet (800 mg total) by mouth every 8 (eight) hours as needed for pain. Patient not taking: Reported on 08/28/2014 12/22/12   Charlestine Night, PA-C  naproxen (NAPROSYN) 500 MG tablet Take 1 tablet (500 mg total) by mouth 2 (two) times daily. Patient not taking: Reported on 02/02/2017 06/14/15   Jaynie Crumble, PA-C  ondansetron (ZOFRAN) 4 MG tablet Take 1 tablet (4  mg total) by mouth every 6 (six) hours. 02/02/17   Raeford Razor, MD    Family History History reviewed. No pertinent family history.  Social History Social History  Substance Use Topics  . Smoking status: Current Every Day Smoker    Packs/day: 0.50    Types: Cigarettes  . Smokeless tobacco: Never Used  . Alcohol use No     Allergies   Patient has no known allergies.   Review of Systems Review of Systems  All systems reviewed and negative, other than as noted in HPI.  Physical Exam Updated Vital Signs BP 125/74   Pulse (!) 48   Temp 98.6 F (37 C) (Oral)   Resp 16   Ht 5\' 8"  (1.727 m)   Wt 172 lb (78 kg)   SpO2 100%   BMI 26.15 kg/m   Physical Exam  Constitutional: He appears well-developed and well-nourished. No distress.  HENT:  Head: Normocephalic and atraumatic.  Eyes: Conjunctivae are normal. Right eye exhibits no discharge. Left eye exhibits no discharge.  Neck: Neck supple.  Cardiovascular: Normal rate, regular rhythm and normal heart sounds.  Exam reveals no gallop and no friction rub.   No murmur heard. Pulmonary/Chest: Effort normal and breath sounds normal. No respiratory distress.  Abdominal: Soft. He exhibits no distension. There is tenderness.  Mild epigastric tenderness   Musculoskeletal: He exhibits no edema or tenderness.  Neurological: He is alert.  Skin: Skin is warm and dry.  Psychiatric: He  has a normal mood and affect. His behavior is normal. Thought content normal.  Nursing note and vitals reviewed.    ED Treatments / Results  Labs (all labs ordered are listed, but only abnormal results are displayed) Labs Reviewed  COMPREHENSIVE METABOLIC PANEL - Abnormal; Notable for the following:       Result Value   Glucose, Bld 103 (*)    All other components within normal limits  LIPASE, BLOOD  CBC    EKG  EKG Interpretation None       Radiology No results found.  Procedures Procedures (including critical care  time)  Medications Ordered in ED Medications  gi cocktail (Maalox,Lidocaine,Donnatal) (30 mLs Oral Given 02/02/17 1014)  ondansetron (ZOFRAN-ODT) disintegrating tablet 4 mg (4 mg Oral Given 02/02/17 1014)     Initial Impression / Assessment and Plan / ED Course  I have reviewed the triage vital signs and the nursing notes.  Pertinent labs & imaging results that were available during my care of the patient were reviewed by me and considered in my medical decision making (see chart for details).    30yM with n/v/d abd epigastric pain. Minimal tenderness on exam. I doubt acute surgical process. HD stable. Afebrile. Possibly viral illness, PUD, gastritis. Doubt emergent condition.   Final Clinical Impressions(s) / ED Diagnoses   Final diagnoses:  Epigastric pain  Nausea vomiting and diarrhea    New Prescriptions Discharge Medication List as of 02/02/2017 10:07 AM    START taking these medications   Details  ondansetron (ZOFRAN) 4 MG tablet Take 1 tablet (4 mg total) by mouth every 6 (six) hours., Starting Mon 02/02/2017, Print         Raeford RazorKohut, [name], MD 02/08/17 [phone]

## 2019-11-10 ENCOUNTER — Encounter (HOSPITAL_COMMUNITY): Payer: Self-pay | Admitting: Emergency Medicine

## 2019-11-10 ENCOUNTER — Other Ambulatory Visit: Payer: Self-pay

## 2019-11-10 ENCOUNTER — Emergency Department (HOSPITAL_COMMUNITY)
Admission: EM | Admit: 2019-11-10 | Discharge: 2019-11-11 | Disposition: A | Discharge status: Home / Self Care | Payer: Self-pay | Attending: Emergency Medicine | Admitting: Emergency Medicine

## 2019-11-10 DIAGNOSIS — X58XXXA Exposure to other specified factors, initial encounter: Secondary | ICD-10-CM | POA: Insufficient documentation

## 2019-11-10 DIAGNOSIS — K0889 Other specified disorders of teeth and supporting structures: Secondary | ICD-10-CM

## 2019-11-10 DIAGNOSIS — F17210 Nicotine dependence, cigarettes, uncomplicated: Secondary | ICD-10-CM | POA: Insufficient documentation

## 2019-11-10 DIAGNOSIS — Y929 Unspecified place or not applicable: Secondary | ICD-10-CM | POA: Insufficient documentation

## 2019-11-10 DIAGNOSIS — Y939 Activity, unspecified: Secondary | ICD-10-CM | POA: Insufficient documentation

## 2019-11-10 DIAGNOSIS — S025XXA Fracture of tooth (traumatic), initial encounter for closed fracture: Secondary | ICD-10-CM | POA: Insufficient documentation

## 2019-11-10 DIAGNOSIS — Y999 Unspecified external cause status: Secondary | ICD-10-CM | POA: Insufficient documentation

## 2019-11-10 NOTE — ED Triage Notes (Signed)
Patient reports left upper dental pain onset yesterday unrelieved by OTC pain medication .

## 2019-11-11 MED ORDER — NAPROXEN 500 MG PO TABS: 500.0000 mg | ORAL_TABLET | Freq: Two times a day (BID) | ORAL | 0 refills | Status: DC

## 2019-11-11 MED ORDER — PENICILLIN V POTASSIUM 250 MG PO TABS
500.0000 mg | ORAL_TABLET | Freq: Once | ORAL | Status: AC
  Administered 2019-11-11: 500 mg via ORAL
  Filled 2019-11-11: qty 2

## 2019-11-11 MED ORDER — NAPROXEN 250 MG PO TABS
500.0000 mg | ORAL_TABLET | Freq: Once | ORAL | Status: AC
  Administered 2019-11-11: 500 mg via ORAL
  Filled 2019-11-11: qty 2

## 2019-11-11 MED ORDER — PENICILLIN V POTASSIUM 500 MG PO TABS: 500.0000 mg | ORAL_TABLET | Freq: Four times a day (QID) | ORAL | 0 refills | Status: DC

## 2019-11-11 NOTE — Discharge Instructions (Addendum)
Take the prescribed medication as directed-- sent to your pharmacy.   Follow-up with dentist as soon as you can.  Dr. Rocky Morel is dentist on call and his office information listed.  There is also a Writer guide on the back with local clinics. Return to the ED for new or worsening symptoms.

## 2019-11-11 NOTE — ED Provider Notes (Signed)
[facility] EMERGENCY DEPARTMENT Provider Note   CSN: [id number] Arrival date & time: 11/10/19  2323     History Chief Complaint  Patient presents with  . Dental Pain    [name] [name] is a 34 y.o. male.  The history is provided by the patient and medical records.  Dental Pain  34 year old male with history of asthma, presenting to the ED with left upper dental pain.  Reports left upper tooth broke about 2 months ago while eating, and since that time has continued breaking off his vaccines.  States over the past 3 days he has started to have worsening pain, and now throbbing throughout the entire left side of his face.  Tooth is extremely sensitive to hot and cold food as well as cold air.  He has tried taking ibuprofen at home without relief.  He is not currently established with a dentist.  He has not had any fever, neck pain, facial swelling, difficulty swallowing.  Past Medical History:  Diagnosis Date  . Asthma     There are no problems to display for this patient.   History reviewed. No pertinent surgical history.     No family history on file.  Social History   Tobacco Use  . Smoking status: Current Every Day Smoker    Packs/day: 0.50    Types: Cigarettes  . Smokeless tobacco: Never Used  Substance Use Topics  . Alcohol use: No  . Drug use: No    Home Medications Prior to Admission medications   Medication Sig Start Date End Date Taking? Authorizing Provider  cyclobenzaprine (FLEXERIL) 10 MG tablet Take 1 tablet (10 mg total) by mouth 2 (two) times daily as needed for muscle spasms. Patient not taking: Reported on 02/02/2017 06/14/15   Jaynie Crumble, PA-C  diphenhydrAMINE (BENADRYL) 25 MG tablet Take 1 tablet (25 mg total) by mouth every 6 (six) hours as needed for itching (Rash). Patient not taking: Reported on 08/28/2014 03/24/14   Antony Madura, PA-C  HYDROcodone-acetaminophen (NORCO/VICODIN) 5-325 MG per tablet Take 1 tablet by  mouth every 6 (six) hours as needed for pain. Patient not taking: Reported on 08/28/2014 12/22/12   Charlestine Night, PA-C  hydrOXYzine (ATARAX/VISTARIL) 25 MG tablet Take 1 tablet (25 mg total) by mouth every 6 (six) hours. Patient not taking: Reported on 08/28/2014 03/24/14   Antony Madura, PA-C  ibuprofen (ADVIL,MOTRIN) 800 MG tablet Take 1 tablet (800 mg total) by mouth every 8 (eight) hours as needed for pain. Patient not taking: Reported on 08/28/2014 12/22/12   Charlestine Night, PA-C  naproxen (NAPROSYN) 500 MG tablet Take 1 tablet (500 mg total) by mouth 2 (two) times daily. Patient not taking: Reported on 02/02/2017 06/14/15   Jaynie Crumble, PA-C  ondansetron (ZOFRAN) 4 MG tablet Take 1 tablet (4 mg total) by mouth every 6 (six) hours. 02/02/17   Raeford Razor, MD    Allergies    Patient has no known allergies.  Review of Systems   Review of Systems  HENT: Positive for dental problem.   All other systems reviewed and are negative.   Physical Exam Updated Vital Signs BP (!) 142/98 (BP Location: Right Arm)   Pulse 63   Temp 98.8 F (37.1 C) (Oral)   Resp 16   SpO2 100%   Physical Exam Vitals and nursing note reviewed.  Constitutional:      Appearance: He is well-developed.  HENT:     Head: Normocephalic and atraumatic.     Mouth/Throat:  Comments: Teeth largely in fair dentition, left upper first premolar (#12) is broken along the posterior aspect with exposed root, surrounding gingiva normal in appearance without noted swelling or abscess formation, handling secretions appropriately, no trismus, no facial or neck swelling, normal phonation without stridor Eyes:     Conjunctiva/sclera: Conjunctivae normal.     Pupils: Pupils are equal, round, and reactive to light.  Cardiovascular:     Rate and Rhythm: Normal rate and regular rhythm.     Heart sounds: Normal heart sounds.  Pulmonary:     Effort: Pulmonary effort is normal. No respiratory distress.      Breath sounds: Normal breath sounds. No rhonchi.  Abdominal:     General: Bowel sounds are normal.     Palpations: Abdomen is soft.  Musculoskeletal:        General: Normal range of motion.     Cervical back: Normal range of motion.  Skin:    General: Skin is warm and dry.  Neurological:     Mental Status: He is alert and oriented to person, place, and time.     ED Results / Procedures / Treatments   Labs (all labs ordered are listed, but only abnormal results are displayed) Labs Reviewed - No data to display  EKG None  Radiology No results found.  Procedures Procedures (including critical care time)  Medications Ordered in ED Medications  naproxen (NAPROSYN) tablet 500 mg (500 mg Oral Given 11/11/19 0023)  penicillin v potassium (VEETID) tablet 500 mg (500 mg Oral Given 11/11/19 0023)    ED Course  I have reviewed the triage vital signs and the nursing notes.  Pertinent labs & imaging results that were available during my care of the patient were reviewed by me and considered in my medical decision making (see chart for details).    MDM Rules/Calculators/A&P  34 y.o. M here with left upper dental pain since tooth broke 2 months ago.  Has been progressively worsening and breaking off more since then.  He is afebrile, non-toxic.  Left upper first premolar (tooth #12) broken along posterior aspect with exposed root.  No gingival swelling or abscess noted.  No facial or neck swelling. No clinical signs/symptoms suggestive of ludwig's angina.  Will start on antibiotics prophylaxis and have him follow-up closely with dentist, resource guide and referral to on call dentist given.  He may return here for any new/acute changes.  Final Clinical Impression(s) / ED Diagnoses Final diagnoses:  Pain, dental  Closed fracture of tooth, initial encounter    Rx / DC Orders ED Discharge Orders         Ordered    penicillin v potassium (VEETID) 500 MG tablet  4 times daily      11/11/19 0022    naproxen (NAPROSYN) 500 MG tablet  2 times daily with meals     11/11/19 0022           Larene Pickett, PA-C 11/11/19 [place]ugh, April, MD 11/11/19 1696

## 2019-11-12 ENCOUNTER — Telehealth: Payer: Self-pay | Admitting: *Deleted

## 2019-11-12 NOTE — Telephone Encounter (Addendum)
TOC CM received call from [facility], pharmacist Shiny on  # [phone] and pt having reaction to Penicillin. Has a rash on some parts of his body. Pt stopped taking and contacted pharmacy. Prescribing ED not available. Sent to Dr Rush Landmark to review. Isidoro Donning RN CCM, [facility] ED TOC CM [phone]  4:24 pm Received order from Dr Anitra Lauth, Clindamycin 150 mg tid, x 7 days. Called into [facility] pharmacist Shiny. She will fill and call pt. Isidoro Donning RN CCM, [facility] ED TOC CM [phone]

## 2020-01-23 ENCOUNTER — Encounter (HOSPITAL_COMMUNITY): Payer: Self-pay | Admitting: *Deleted

## 2020-01-23 ENCOUNTER — Emergency Department (HOSPITAL_COMMUNITY)
Admission: EM | Admit: 2020-01-23 | Discharge: 2020-01-23 | Disposition: A | Discharge status: Home / Self Care | Payer: Medicaid Other | Attending: Emergency Medicine | Admitting: Emergency Medicine

## 2020-01-23 ENCOUNTER — Other Ambulatory Visit: Payer: Self-pay

## 2020-01-23 DIAGNOSIS — F17210 Nicotine dependence, cigarettes, uncomplicated: Secondary | ICD-10-CM | POA: Insufficient documentation

## 2020-01-23 DIAGNOSIS — X501XXA Overexertion from prolonged static or awkward postures, initial encounter: Secondary | ICD-10-CM | POA: Insufficient documentation

## 2020-01-23 DIAGNOSIS — M62830 Muscle spasm of back: Secondary | ICD-10-CM | POA: Insufficient documentation

## 2020-01-23 DIAGNOSIS — Y990 Civilian activity done for income or pay: Secondary | ICD-10-CM | POA: Insufficient documentation

## 2020-01-23 DIAGNOSIS — S39012A Strain of muscle, fascia and tendon of lower back, initial encounter: Secondary | ICD-10-CM

## 2020-01-23 DIAGNOSIS — S29012A Strain of muscle and tendon of back wall of thorax, initial encounter: Secondary | ICD-10-CM | POA: Insufficient documentation

## 2020-01-23 DIAGNOSIS — Y929 Unspecified place or not applicable: Secondary | ICD-10-CM | POA: Insufficient documentation

## 2020-01-23 DIAGNOSIS — Y939 Activity, unspecified: Secondary | ICD-10-CM | POA: Insufficient documentation

## 2020-01-23 MED ORDER — NAPROXEN 375 MG PO TABS: 375.0000 mg | ORAL_TABLET | Freq: Two times a day (BID) | ORAL | 0 refills | Status: DC

## 2020-01-23 MED ORDER — METHOCARBAMOL 500 MG PO TABS: 1000.0000 mg | ORAL_TABLET | Freq: Every evening | ORAL | 0 refills | Status: DC | PRN

## 2020-01-23 NOTE — ED Triage Notes (Addendum)
To ED for eval of right side back pain - this started after pt stepped up into his [employer]iece while on break from work. Pain increases with any movement or palpation to back. No SOB. No crepitus noted. Resp even and unlabored until movement and pain starts. Pt denies any cp or abd pain. No pain or numbness to legs. No trouble with urination. Tylenol taken pta

## 2020-01-23 NOTE — Discharge Instructions (Signed)
Please read and follow all provided instructions.  Your diagnoses today include:  1. Back strain, initial encounter   2. Muscle spasm of back     Tests performed today include:  Vital signs - see below for your results today  Medications prescribed:   Robaxin (methocarbamol) - muscle relaxer medication  DO NOT drive or perform any activities that require you to be awake and alert because this medicine can make you drowsy.    Naproxen - anti-inflammatory pain medication  Do not exceed 500mg  naproxen every 12 hours, take with food  You have been prescribed an anti-inflammatory medication or NSAID. Take with food. Take smallest effective dose for the shortest duration needed for your pain. Stop taking if you experience stomach pain or vomiting.   Take any prescribed medications only as directed.  Home care instructions:   Follow any educational materials contained in this packet  Please rest, use ice or heat on your back for the next several days  Do not lift, push, pull anything more than 10 pounds for the next week  Follow-up instructions: Please follow-up with your primary care provider in the next 1 week for further evaluation of your symptoms.   Return instructions:  SEEK IMMEDIATE MEDICAL ATTENTION IF YOU HAVE:  New numbness, tingling, weakness, or problem with the use of your arms or legs  Severe back pain not relieved with medications  Loss control of your bowels or bladder  Increasing pain in any areas of the body (such as chest or abdominal pain)  Shortness of breath, dizziness, or fainting.   Worsening nausea (feeling sick to your stomach), vomiting, fever, or sweats  Any other emergent concerns regarding your health   Additional Information:  Your vital signs today were: BP 136/83   Pulse 82   Temp 98.3 F (36.8 C) (Oral)   Resp 20   Ht 5\' 8"  (1.727 m)   Wt 74.8 kg   SpO2 100%   BMI 25.09 kg/m  If your blood pressure (BP) was elevated above  135/85 this visit, please have this repeated by your doctor within one month. --------------

## 2020-01-23 NOTE — ED Notes (Signed)
Discharge instructions discussed with patient, patient verbalized understanding and left emergency department NAD

## 2020-01-23 NOTE — ED Provider Notes (Addendum)
[facility] EMERGENCY DEPARTMENT Provider Note   CSN: [id number] Arrival date & time: 01/23/20  1524     History Chief Complaint  Patient presents with  . Back Pain    [name] [name] is a 34 y.o. male.  Patient presents to the emergency department with complaint of cute onset of right middle back and lateral rib pain starting about 2 PM today when he went to take a break from work.  He denies any injuries.  He does work in [employer] where he does a lot of twisting movements and bending.  He took Tylenol prior to arrival without much improvement.  Pain does not radiate.  It is worse when he moves or twists and is better when he is sitting still.  No chest pain or shortness of breath.  Patient denies warning symptoms of back pain including: fecal incontinence, urinary retention or overflow incontinence, night sweats, waking from sleep with back pain, unexplained fevers or weight loss, h/o cancer, IVDU, recent trauma.           Past Medical History:  Diagnosis Date  . Asthma     There are no problems to display for this patient.   History reviewed. No pertinent surgical history.     No family history on file.  Social History   Tobacco Use  . Smoking status: Current Every Day Smoker    Packs/day: 0.50    Types: Cigarettes  . Smokeless tobacco: Never Used  Substance Use Topics  . Alcohol use: No  . Drug use: No    Home Medications Prior to Admission medications   Medication Sig Start Date End Date Taking? Authorizing Provider  methocarbamol (ROBAXIN) 500 MG tablet Take 2 tablets (1,000 mg total) by mouth at bedtime as needed for muscle spasms. 01/23/20   Renne Crigler, PA-C  naproxen (NAPROSYN) 375 MG tablet Take 1 tablet (375 mg total) by mouth 2 (two) times daily. 01/23/20   Renne Crigler, PA-C  diphenhydrAMINE (BENADRYL) 25 MG tablet Take 1 tablet (25 mg total) by mouth every 6 (six) hours as needed for itching (Rash). Patient not taking:  Reported on 08/28/2014 03/24/14 01/23/20  Antony Madura, PA-C    Allergies    Patient has no known allergies.  Review of Systems   Review of Systems  Constitutional: Negative for fever and unexpected weight change.  Respiratory: Negative for shortness of breath.   Cardiovascular: Negative for chest pain.  Gastrointestinal: Negative for constipation.       Neg for fecal incontinence  Genitourinary: Negative for difficulty urinating, flank pain and hematuria.       Negative for urinary incontinence or retention  Musculoskeletal: Positive for back pain.  Neurological: Negative for weakness and numbness.       Negative for saddle paresthesias     Physical Exam Updated Vital Signs BP 136/83   Pulse 82   Temp 98.3 F (36.8 C) (Oral)   Resp 20   Ht 5\' 8"  (1.727 m)   Wt 74.8 kg   SpO2 100%   BMI 25.09 kg/m   Physical Exam Vitals and nursing note reviewed.  Constitutional:      Appearance: He is well-developed.  HENT:     Head: Normocephalic and atraumatic.  Eyes:     Conjunctiva/sclera: Conjunctivae normal.  Abdominal:     Palpations: Abdomen is soft.     Tenderness: There is no abdominal tenderness.  Musculoskeletal:        General: Tenderness present. Normal range  of motion.     Cervical back: Normal and normal range of motion. No tenderness or bony tenderness.     Thoracic back: Tenderness present. No bony tenderness.     Lumbar back: Normal. No tenderness or bony tenderness.       Back:     Comments: No step-off noted with palpation of spine.   Skin:    General: Skin is warm and dry.  Neurological:     Mental Status: He is alert.     Sensory: No sensory deficit.     Motor: No abnormal muscle tone.     Deep Tendon Reflexes: Reflexes are normal and symmetric.     Comments: 5/5 strength in entire lower extremities bilaterally. No sensation deficit.      ED Results / Procedures / Treatments   Labs (all labs ordered are listed, but only abnormal results are  displayed) Labs Reviewed - No data to display  EKG None  Radiology No results found.  Procedures Procedures (including critical care time)  Medications Ordered in ED Medications - No data to display  ED Course  I have reviewed the triage vital signs and the nursing notes.  Pertinent labs & imaging results that were available during my care of the patient were reviewed by me and considered in my medical decision making (see chart for details).  5:36 PM Patient seen and examined.   Vital signs reviewed and are as follows: Vitals:   01/23/20 1529 01/23/20 1733  BP: 136/83 133/76  Pulse: 82   Resp: 20 16  Temp: 98.3 F (36.8 C) 98.3 F (36.8 C)  SpO2: 100% 98%    No red flag s/s of low back pain. Patient was counseled on back pain precautions and told to do activity as tolerated but do not lift, push, or pull heavy objects more than 10 pounds for the next week.  Patient counseled to use ice or heat on back for no longer than 15 minutes every hour.   Patient counseled on proper use of muscle relaxant medication.  They were told not to drink alcohol, drive any vehicle, or do any dangerous activities while taking this medication.  Patient verbalized understanding.  Patient urged to follow-up with PCP if pain does not improve with treatment and rest or if pain becomes recurrent. Urged to return with worsening severe pain, loss of bowel or bladder control, trouble walking.   The patient verbalizes understanding and agrees with the plan.    MDM Rules/Calculators/A&P                      Patient with back pain, likely muscle spasm due to overuse injury. No neurological deficits. Patient is ambulatory. No warning symptoms of back pain including: fecal incontinence, urinary retention or overflow incontinence, night sweats, waking from sleep with back pain, unexplained fevers or weight loss, h/o cancer, IVDU, recent trauma. No concern for cauda equina, epidural abscess, or other  serious cause of back pain. Conservative measures such as rest, ice/heat and pain medicine indicated with PCP follow-up if no improvement with conservative management.     Final Clinical Impression(s) / ED Diagnoses Final diagnoses:  Back strain, initial encounter  Muscle spasm of back    Rx / DC Orders ED Discharge Orders         Ordered    naproxen (NAPROSYN) 375 MG tablet  2 times daily     01/23/20 1725    methocarbamol (ROBAXIN) 500 MG tablet  At bedtime PRN     01/23/20 1725              Carlisle Cater, Hershal Coria 01/23/20 1737    Lajean Saver, MD 01/23/20 [phone]

## 2020-11-30 ENCOUNTER — Emergency Department (HOSPITAL_COMMUNITY)
Admission: EM | Admit: 2020-11-30 | Discharge: 2020-11-30 | Disposition: A | Discharge status: Home / Self Care | Payer: Medicaid Other | Attending: Emergency Medicine | Admitting: Emergency Medicine

## 2020-11-30 ENCOUNTER — Other Ambulatory Visit: Payer: Self-pay

## 2020-11-30 DIAGNOSIS — K047 Periapical abscess without sinus: Secondary | ICD-10-CM | POA: Insufficient documentation

## 2020-11-30 MED ORDER — PENICILLIN V POTASSIUM 500 MG PO TABS: 500.0000 mg | ORAL_TABLET | Freq: Four times a day (QID) | ORAL | 0 refills | Status: AC

## 2020-11-30 NOTE — ED Triage Notes (Signed)
Pt here with reports of R lower dental pain X3 days.

## 2020-11-30 NOTE — Discharge Instructions (Addendum)
Suspect you are suffering from a dental cavity.  I have given you prescription for antibiotics please take as prescribed.  For pain you may take over-the-counter pain medications like ibuprofen and/or Tylenol every 6 hours as needed please follow dosage in the back of bottle.  Given you a list of resources for dentist is within the area please call as you will need further evaluation.  Come back to the emergency department if you develop chest pain, shortness of breath, severe abdominal pain, uncontrolled nausea, vomiting, diarrhea.

## 2020-11-30 NOTE — ED Provider Notes (Signed)
[facility] EMERGENCY DEPARTMENT Provider Note   CSN: [id number] Arrival date & time: 11/30/20  1405     History No chief complaint on file.   [name] [name] is a 35 y.o. male.  HPI   Patient with no significant medical history presents to the emergency department with chief complaint of right bottom dental pain.  Patient endorses pain started approximately 3 days ago, he describes it as a dull aching sensation in his right tooth, he has increased sensitivity to hot and cold, states chewing on that side makes the pain worse.  He thinks he sees some swelling on the right side of his face.  He denies trismus or torticollis, difficulty with swallowing, denies fevers, chills, recent trauma to the area.  He endorses that he has had issues with his teeth in the past, he has not seen a dentist as long time.  He denies any alleviating factors.  Patient denies headaches, fevers, chills, shortness breath, chest pain, abdominal pain, nausea, vomiting, diarrhea, worsening pedal edema.  Past Medical History:  Diagnosis Date  . Asthma     There are no problems to display for this patient.   No past surgical history on file.     No family history on file.  Social History   Tobacco Use  . Smoking status: Current Every Day Smoker    Packs/day: 0.50    Types: Cigarettes  . Smokeless tobacco: Never Used  Substance Use Topics  . Alcohol use: No  . Drug use: No    Home Medications Prior to Admission medications   Medication Sig Start Date End Date Taking? Authorizing Provider  penicillin v potassium (VEETID) 500 MG tablet Take 1 tablet (500 mg total) by mouth 4 (four) times daily for 7 days. 11/30/20 12/07/20 Yes Carroll Sage, PA-C  methocarbamol (ROBAXIN) 500 MG tablet Take 2 tablets (1,000 mg total) by mouth at bedtime as needed for muscle spasms. 01/23/20   Renne Crigler, PA-C  naproxen (NAPROSYN) 375 MG tablet Take 1 tablet (375 mg total) by mouth 2 (two) times  daily. 01/23/20   Renne Crigler, PA-C  diphenhydrAMINE (BENADRYL) 25 MG tablet Take 1 tablet (25 mg total) by mouth every 6 (six) hours as needed for itching (Rash). Patient not taking: Reported on 08/28/2014 03/24/14 01/23/20  Antony Madura, PA-C    Allergies    Patient has no known allergies.  Review of Systems   Review of Systems  Constitutional: Negative for chills and fever.  HENT: Positive for dental problem and facial swelling. Negative for congestion and sore throat.   Eyes: Negative for visual disturbance.  Respiratory: Negative for shortness of breath.   Cardiovascular: Negative for chest pain.  Gastrointestinal: Negative for abdominal pain.  Genitourinary: Negative for enuresis.  Musculoskeletal: Negative for back pain.  Skin: Negative for rash.  Neurological: Negative for headaches.  Hematological: Does not bruise/bleed easily.    Physical Exam Updated Vital Signs BP (!) 143/87 (BP Location: Right Arm)   Pulse 73   Temp 98.8 F (37.1 C) (Oral)   Resp 16   SpO2 100%   Physical Exam Vitals and nursing note reviewed.  Constitutional:      General: He is not in acute distress.    Appearance: He is not ill-appearing.  HENT:     Head: Normocephalic and atraumatic.     Nose: No congestion.     Mouth/Throat:     Mouth: Mucous membranes are moist.     Pharynx: Oropharynx is  clear. No oropharyngeal exudate or posterior oropharyngeal erythema.     Comments: Patient's oral pharynx is visualized tongue and uvula are both midline, he is controlling his own secretions with no difficulty.  He has poor dental hygiene, right bottom incisor has decay present, there is no surrounding erythema within the gumlines.  Gumlines were palpated no fluctuance or indurations present. Eyes:     Extraocular Movements: Extraocular movements intact.     Conjunctiva/sclera: Conjunctivae normal.  Cardiovascular:     Rate and Rhythm: Normal rate.  Pulmonary:     Effort: Pulmonary effort is  normal.  Musculoskeletal:     Comments: Patient is moving all 4 extremities at difficulty.  Skin:    General: Skin is warm and dry.  Neurological:     Mental Status: He is alert.  Psychiatric:        Mood and Affect: Mood normal.     ED Results / Procedures / Treatments   Labs (all labs ordered are listed, but only abnormal results are displayed) Labs Reviewed - No data to display  EKG None  Radiology No results found.  Procedures Procedures   Medications Ordered in ED Medications - No data to display  ED Course  I have reviewed the triage vital signs and the nursing notes.  Pertinent labs & imaging results that were available during my care of the patient were reviewed by me and considered in my medical decision making (see chart for details).    MDM Rules/Calculators/A&P                         Initial impression-patient presents with right-sided dental pain.  He is alert, does not appear in acute distress, vital signs reassuring.  Work-up-due to well-appearing patient, benign physical exam, further lab imaging not warranted at this time.  Rule out- I have low suspicion for peritonsillar abscess, retropharyngeal abscess, or Ludwig angina as oropharynx was visualized tongue and uvula were both midline, there is no exudates, erythema or edema noted in the posterior pillars or on/ around tonsils.  Low suspicion for an abscess as gumline were palpated no fluctuance or induration felt.  Low suspicion for periorbital or orbital cellulitis as patient face had no erythematous, patient EOMs were intact, he had no pain with eye movement.  Plan-suspect patient suffering from a dental infection, will start him on antibiotics and refer him to a dentist for further evaluation.  Vital signs have remained stable, no indication for hospital admission. Patient given at home care as well strict return precautions.  Patient verbalized that they understood agreed to said plan.   Final  Clinical Impression(s) / ED Diagnoses Final diagnoses:  Dental infection    Rx / DC Orders ED Discharge Orders         Ordered    penicillin v potassium (VEETID) 500 MG tablet  4 times daily        11/30/20 1742           Barnie Del 11/30/20 1751    Vanetta Mulders, MD 12/06/20 [phone]

## 2020-11-30 NOTE — ED Notes (Signed)
Patient verbalizes understanding of discharge instructions. Opportunity for questioning and answers were provided. Armband removed by staff, pt discharged from ED.  

## 2022-07-19 ENCOUNTER — Encounter (HOSPITAL_COMMUNITY): Payer: Self-pay

## 2022-07-19 ENCOUNTER — Ambulatory Visit (HOSPITAL_COMMUNITY)
Admission: RE | Admit: 2022-07-19 | Discharge: 2022-07-19 | Disposition: A | Discharge status: Home / Self Care | Payer: Self-pay | Source: Ambulatory Visit | Attending: Emergency Medicine | Admitting: Emergency Medicine

## 2022-07-19 ENCOUNTER — Ambulatory Visit (INDEPENDENT_AMBULATORY_CARE_PROVIDER_SITE_OTHER): Payer: Self-pay

## 2022-07-19 VITALS — BP 130/72 | HR 82 | Temp 98.6°F | Resp 16

## 2022-07-19 DIAGNOSIS — L03113 Cellulitis of right upper limb: Secondary | ICD-10-CM

## 2022-07-19 DIAGNOSIS — M79641 Pain in right hand: Secondary | ICD-10-CM

## 2022-07-19 MED ORDER — DOXYCYCLINE HYCLATE 100 MG PO CAPS: 100.0000 mg | ORAL_CAPSULE | Freq: Two times a day (BID) | ORAL | 0 refills | Status: DC

## 2022-07-19 MED ORDER — AMOXICILLIN 875 MG PO TABS: 875.0000 mg | ORAL_TABLET | Freq: Two times a day (BID) | ORAL | 0 refills | Status: DC

## 2022-07-19 NOTE — ED Triage Notes (Signed)
Pt reports a bump on his right hand with swelling x 1 month. Pt denies any drainage at this time.

## 2022-07-19 NOTE — ED Provider Notes (Signed)
[facility]    CSN: [id number] Arrival date & time: 07/19/22  1554      History   Chief Complaint Chief Complaint  Patient presents with   Hand Problem    HPI [name] [name] is a 36 y.o. male.  Patient complaining of right hand swelling x 1 month.  Patient denies trauma to that hand.  Patient reports onset of symptoms began with a callus on his hand.  Patient reports right hand pain at times especially worsening at night . Patient denies drainage from site.  Patient denies history of MRSA or any skin infections.  Patient has been using ice application and over-the-counter topical cream with no relief of symptoms.   HPI  Past Medical History:  Diagnosis Date   Asthma     There are no problems to display for this patient.   History reviewed. No pertinent surgical history.     Home Medications    Prior to Admission medications   Medication Sig Start Date End Date Taking? Authorizing Provider  amoxicillin (AMOXIL) 875 MG tablet Take 1 tablet (875 mg total) by mouth 2 (two) times daily. 07/19/22  Yes Debby Freiberg, NP  doxycycline (VIBRAMYCIN) 100 MG capsule Take 1 capsule (100 mg total) by mouth 2 (two) times daily. 07/19/22  Yes Debby Freiberg, NP  methocarbamol (ROBAXIN) 500 MG tablet Take 2 tablets (1,000 mg total) by mouth at bedtime as needed for muscle spasms. 01/23/20   Renne Crigler, PA-C  naproxen (NAPROSYN) 375 MG tablet Take 1 tablet (375 mg total) by mouth 2 (two) times daily. 01/23/20   Renne Crigler, PA-C  diphenhydrAMINE (BENADRYL) 25 MG tablet Take 1 tablet (25 mg total) by mouth every 6 (six) hours as needed for itching (Rash). Patient not taking: Reported on 08/28/2014 03/24/14 01/23/20  Antony Madura, PA-C    Family History History reviewed. No pertinent family history.  Social History Social History   Tobacco Use   Smoking status: Every Day    Packs/day: 0.50    Types: Cigarettes   Smokeless tobacco: Never  Substance Use  Topics   Alcohol use: No   Drug use: No     Allergies   Patient has no known allergies.   Review of Systems Review of Systems  Constitutional:  Negative for chills and fever.  Musculoskeletal:  Positive for joint swelling.       RT hand swelling   Skin:  Positive for color change and wound.       Skin change to RT hand     Physical Exam Triage Vital Signs ED Triage Vitals [07/19/22 1633]  Enc Vitals Group     BP 130/72     Pulse Rate 82     Resp 16     Temp 98.6 F (37 C)     Temp Source Oral     SpO2 99 %     Weight      Height      Head Circumference      Peak Flow      Pain Score      Pain Loc      Pain Edu?      Excl. in GC?    No data found.  Updated Vital Signs BP 130/72 (BP Location: Left Arm)   Pulse 82   Temp 98.6 F (37 C) (Oral)   Resp 16   SpO2 99%   Physical Exam Vitals and nursing note reviewed.  Constitutional:  Appearance: Normal appearance.  Cardiovascular:     Pulses:          Radial pulses are 2+ on the right side.  Musculoskeletal:     Right hand: Swelling, tenderness and bony tenderness present. Normal range of motion. Normal strength. Decreased sensation of the radial distribution. Normal capillary refill. Normal pulse.     Left hand: Normal.     Comments: Erythema present over dorsal side of hand. Callous present on palmar side of hand.   Neurological:     Mental Status: He is alert.         UC Treatments / Results  Labs (all labs ordered are listed, but only abnormal results are displayed) Labs Reviewed - No data to display  EKG   Radiology DG Hand Complete Right  Result Date: 07/19/2022 CLINICAL DATA:  Right hand swelling for 1 month EXAM: RIGHT HAND - COMPLETE 3+ VIEW COMPARISON:  None Available. FINDINGS: There is no evidence of fracture or dislocation. No erosion or periosteal elevation. There is no evidence of arthropathy or other focal bone abnormality. Marked soft tissue swelling of the hand, most  pronounced dorsally. No soft tissue gas. No radiopaque foreign body is seen within the soft tissues. IMPRESSION: 1. Marked soft tissue swelling of the hand, most pronounced dorsally. No soft tissue gas or radiopaque foreign body. 2. No acute osseous abnormality. Electronically Signed   By: Davina Poke D.O.   On: 07/19/2022 17:28    Procedures Procedures (including critical care time)  Medications Ordered in UC Medications - No data to display  Initial Impression / Assessment and Plan / UC Course  I have reviewed the triage vital signs and the nursing notes.  Pertinent labs & imaging results that were available during my care of the patient were reviewed by me and considered in my medical decision making (see chart for details).     Patient was treated for cellulitis of the right hand . Right hand x-ray performed in office showed soft tissue swelling but no acute osseous abnormality.  Amoxicillin and doxycycline were sent to the pharmacy.  Patient was educated on potential side effects and medication administration.  Patient was educated on timeline for resolution of symptoms.  Patient was made aware if symptoms are not improving by day 5 should he should return for follow-up.  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  Patient reports that he will wear gloves at work. Patient was made aware of red flag symptoms that would warrant an emergency department visit.  Patient verbalized understanding of instructions.  Final Clinical Impressions(s) / UC Diagnoses   Final diagnoses:  Cellulitis of right upper extremity     Discharge Instructions      I have sent 2 antibiotics to the pharmacy.  You will take each of these antibiotics 2 times daily for the next 7 days.  Please continue to monitor symptoms at home.  If symptoms are worsening or not improved a 5 of antibiotic regiment please return to clinic for follow-up.   Please take these  medications with food or stomach to prevent potential stomach upset caused by antibiotics.   If you begin having severe pain or spreading of redness up your arm, please go to the emergency department for follow-up.      ED Prescriptions     Medication Sig Dispense Auth. Provider   doxycycline (VIBRAMYCIN) 100 MG capsule Take 1 capsule (100 mg total) by mouth 2 (two)  times daily. 14 capsule Debby Freiberg, NP   amoxicillin (AMOXIL) 875 MG tablet Take 1 tablet (875 mg total) by mouth 2 (two) times daily. 7 tablet Debby Freiberg, NP      PDMP not reviewed this encounter.   Debby Freiberg, NP 07/19/22 1800

## 2022-07-19 NOTE — Discharge Instructions (Addendum)
I have sent 2 antibiotics to the pharmacy.  You will take each of these antibiotics 2 times daily for the next 7 days.  Please continue to monitor symptoms at home.  If symptoms are worsening or not improved a 5 of antibiotic regiment please return to clinic for follow-up.   Please take these medications with food or stomach to prevent potential stomach upset caused by antibiotics.   If you begin having severe pain or spreading of redness up your arm, please go to the emergency department for follow-up.

## 2024-02-05 ENCOUNTER — Ambulatory Visit
Admission: EM | Admit: 2024-02-05 | Discharge: 2024-02-05 | Disposition: A | Discharge status: Home / Self Care | Payer: Self-pay | Attending: Emergency Medicine | Admitting: Emergency Medicine

## 2024-02-05 ENCOUNTER — Encounter: Payer: Self-pay | Admitting: Emergency Medicine

## 2024-02-05 DIAGNOSIS — S29012A Strain of muscle and tendon of back wall of thorax, initial encounter: Secondary | ICD-10-CM

## 2024-02-05 DIAGNOSIS — M62830 Muscle spasm of back: Secondary | ICD-10-CM

## 2024-02-05 MED ORDER — DEXAMETHASONE SODIUM PHOSPHATE 10 MG/ML IJ SOLN
10.0000 mg | Freq: Once | INTRAMUSCULAR | Status: AC
  Administered 2024-02-05: 10 mg via INTRAMUSCULAR

## 2024-02-05 MED ORDER — METHOCARBAMOL 500 MG PO TABS: 500.0000 mg | ORAL_TABLET | Freq: Two times a day (BID) | ORAL | 0 refills | Status: DC

## 2024-02-05 NOTE — ED Triage Notes (Signed)
 PT c/o left side back pain that began early this morning. States it hurts to move or take deep breaths. He took ibuprofen  early this morning which helped some.    Denies urinary symptoms. 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

## 2024-02-05 NOTE — Discharge Instructions (Signed)
 The steroid injection to help with pain and inflammation.  You can take the Robaxin  up to 2 times daily, do not drink or drive on this medication as it may cause drowsiness or sedation.  Heat, gentle stretching and warm Epsom salt baths may help loosen your stiff muscles as well.  Take it easy over the next few days and then gradually return to activities as tolerated.  Return to clinic for any new or urgent symptoms.

## 2024-02-05 NOTE — ED Provider Notes (Signed)
 [name]    CSN: [id number] Arrival date & time: 02/05/24  1318      History   Chief Complaint Chief Complaint  Patient presents with   Back Pain    HPI [name] is a 38 y.o. male.   Patient presents to clinic over concerns of left-sided thoracic back pain.  He woke up around 5 AM and felt like his back was tight and in a spasm.  It hurts to move his back or take deep breaths.  Around 6 AM he took some ibuprofen  and the pain improved, but did not go away.  Over the past hour the pain is increased.  He works in [employer] and will do a lot of heavy lifting.  Yesterday he lifted 50 pound bags of rice and onions.  Did not have any pain after lifting and was able to complete his shift without difficulty.  Has not had any trauma or falls.  Denies urinary symptoms.  Denies numbness or tingling.  The history is provided by the patient and medical records.  Back Pain   Past Medical History:  Diagnosis Date   Asthma     There are no active problems to display for this patient.   History reviewed. No pertinent surgical history.     Home Medications    Prior to Admission medications   Medication Sig Start Date End Date Taking? Authorizing Provider  methocarbamol  (ROBAXIN ) 500 MG tablet Take 1 tablet (500 mg total) by mouth 2 (two) times daily. 02/05/24  Yes Justyn Langham  N, FNP  diphenhydrAMINE  (BENADRYL ) 25 MG tablet Take 1 tablet (25 mg total) by mouth every 6 (six) hours as needed for itching (Rash). Patient not taking: Reported on 08/28/2014 03/24/14 01/23/20  Carleton Cheek, PA-C    Family History History reviewed. No pertinent family history.  Social History Social History   Tobacco Use   Smoking status: Every Day    Current packs/day: 0.50    Types: Cigarettes   Smokeless tobacco: Never  Substance Use Topics   Alcohol use: No   Drug use: No     Allergies   Patient has no known allergies.   Review of Systems Review of  Systems  Per HPI  Physical Exam Triage Vital Signs ED Triage Vitals  Encounter Vitals Group     BP 02/05/24 1325 125/79     Systolic BP Percentile --      Diastolic BP Percentile --      Pulse Rate 02/05/24 1325 92     Resp 02/05/24 1325 16     Temp 02/05/24 1325 99.1 F (37.3 C)     Temp Source 02/05/24 1325 Oral     SpO2 02/05/24 1325 94 %     Weight --      Height --      Head Circumference --      Peak Flow --      Pain Score 02/05/24 1332 8     Pain Loc --      Pain Education --      Exclude from Growth Chart --    No data found.  Updated Vital Signs BP 125/79 (BP Location: Right Arm)   Pulse 92   Temp 99.1 F (37.3 C) (Oral)   Resp 16   SpO2 94%   Visual Acuity Right Eye Distance:   Left Eye Distance:   Bilateral Distance:    Right Eye Near:   Left Eye Near:    Bilateral  Near:     Physical Exam Vitals and nursing note reviewed.  Constitutional:      Appearance: Normal appearance.  HENT:     Head: Normocephalic and atraumatic.     Right Ear: External ear normal.     Left Ear: External ear normal.     Nose: Nose normal.     Mouth/Throat:     Mouth: Mucous membranes are moist.  Eyes:     Conjunctiva/sclera: Conjunctivae normal.  Cardiovascular:     Rate and Rhythm: Normal rate.  Pulmonary:     Effort: Pulmonary effort is normal. No respiratory distress.  Musculoskeletal:        General: Normal range of motion.       Back:  Skin:    General: Skin is warm and dry.     Findings: No rash.  Neurological:     General: No focal deficit present.     Mental Status: He is alert.  Psychiatric:        Mood and Affect: Mood normal.        Behavior: Behavior is cooperative.      UC Treatments / Results  Labs (all labs ordered are listed, but only abnormal results are displayed) Labs Reviewed - No data to display  EKG   Radiology No results found.  Procedures Procedures (including critical care time)  Medications Ordered in  UC Medications  dexamethasone  (DECADRON ) injection 10 mg (has no administration in time range)    Initial Impression / Assessment and Plan / UC Course  I have reviewed the triage vital signs and the nursing notes.  Pertinent labs & imaging results that were available during my care of the patient were reviewed by me and considered in my medical decision making (see chart for details).  Vitals in triage reviewed, patient is hemodynamically stable.  Left-sided thoracic back pain appears muscular in nature, triggered with range of motion, most likely caused by improper heavy lifting.  Atraumatic.  Imaging deferred.  Spine without step-off or deformity.  Without red flag symptoms such as numbness, tingling or weakness.  IM steroid given in clinic.  Muscle relaxers advised.  Plan of care, follow-up care return precautions given, no questions at this time.  Work note provided.     Final Clinical Impressions(s) / UC Diagnoses   Final diagnoses:  Strain of thoracic back region  Muscle spasm of back     Discharge Instructions      The steroid injection to help with pain and inflammation.  You can take the Robaxin  up to 2 times daily, do not drink or drive on this medication as it may cause drowsiness or sedation.  Heat, gentle stretching and warm Epsom salt baths may help loosen your stiff muscles as well.  Take it easy over the next few days and then gradually return to activities as tolerated.  Return to clinic for any new or urgent symptoms.  ED Prescriptions     Medication Sig Dispense Auth. Provider   methocarbamol  (ROBAXIN ) 500 MG tablet Take 1 tablet (500 mg total) by mouth 2 (two) times daily. 20 tablet Harlow Lighter, Rand Etchison  N, FNP      PDMP not reviewed this encounter.   Harlow Lighter, Roylene Heaton  N, FNP 02/05/24 1353

## 2024-02-07 ENCOUNTER — Emergency Department (HOSPITAL_COMMUNITY): Payer: Self-pay

## 2024-02-07 ENCOUNTER — Encounter (HOSPITAL_COMMUNITY): Payer: Self-pay | Admitting: *Deleted

## 2024-02-07 ENCOUNTER — Emergency Department (HOSPITAL_COMMUNITY)
Admission: EM | Admit: 2024-02-07 | Discharge: 2024-02-07 | Disposition: A | Discharge status: Home / Self Care | Payer: Self-pay | Attending: Emergency Medicine | Admitting: Emergency Medicine

## 2024-02-07 ENCOUNTER — Other Ambulatory Visit: Payer: Self-pay

## 2024-02-07 DIAGNOSIS — D72829 Elevated white blood cell count, unspecified: Secondary | ICD-10-CM | POA: Insufficient documentation

## 2024-02-07 DIAGNOSIS — J181 Lobar pneumonia, unspecified organism: Secondary | ICD-10-CM | POA: Insufficient documentation

## 2024-02-07 DIAGNOSIS — J45909 Unspecified asthma, uncomplicated: Secondary | ICD-10-CM | POA: Insufficient documentation

## 2024-02-07 DIAGNOSIS — E871 Hypo-osmolality and hyponatremia: Secondary | ICD-10-CM | POA: Insufficient documentation

## 2024-02-07 DIAGNOSIS — E876 Hypokalemia: Secondary | ICD-10-CM | POA: Insufficient documentation

## 2024-02-07 DIAGNOSIS — J189 Pneumonia, unspecified organism: Secondary | ICD-10-CM

## 2024-02-07 LAB — RESP PANEL BY RT-PCR (RSV, FLU A&B, COVID)  RVPGX2
Influenza A by PCR: NEGATIVE
Influenza B by PCR: NEGATIVE
Resp Syncytial Virus by PCR: NEGATIVE
SARS Coronavirus 2 by RT PCR: NEGATIVE

## 2024-02-07 LAB — URINALYSIS, W/ REFLEX TO CULTURE (INFECTION SUSPECTED)
Bacteria, UA: NONE SEEN
Bilirubin Urine: NEGATIVE
Glucose, UA: NEGATIVE mg/dL
Ketones, ur: NEGATIVE mg/dL
Leukocytes,Ua: NEGATIVE
Nitrite: NEGATIVE
Protein, ur: NEGATIVE mg/dL
Specific Gravity, Urine: 1.026 (ref 1.005–1.030)
pH: 5 (ref 5.0–8.0)

## 2024-02-07 LAB — CBC WITH DIFFERENTIAL/PLATELET
Abs Immature Granulocytes: 0.26 10*3/uL — ABNORMAL HIGH (ref 0.00–0.07)
Basophils Absolute: 0.1 10*3/uL (ref 0.0–0.1)
Basophils Relative: 0 %
Eosinophils Absolute: 0 10*3/uL (ref 0.0–0.5)
Eosinophils Relative: 0 %
HCT: 39.4 % (ref 39.0–52.0)
Hemoglobin: 13.4 g/dL (ref 13.0–17.0)
Immature Granulocytes: 1 %
Lymphocytes Relative: 11 %
Lymphs Abs: 2.3 10*3/uL (ref 0.7–4.0)
MCH: 31.1 pg (ref 26.0–34.0)
MCHC: 34 g/dL (ref 30.0–36.0)
MCV: 91.4 fL (ref 80.0–100.0)
Monocytes Absolute: 1.6 10*3/uL — ABNORMAL HIGH (ref 0.1–1.0)
Monocytes Relative: 8 %
Neutro Abs: 17 10*3/uL — ABNORMAL HIGH (ref 1.7–7.7)
Neutrophils Relative %: 80 %
Platelets: 143 10*3/uL — ABNORMAL LOW (ref 150–400)
RBC: 4.31 MIL/uL (ref 4.22–5.81)
RDW: 13.7 % (ref 11.5–15.5)
WBC: 21.2 10*3/uL — ABNORMAL HIGH (ref 4.0–10.5)
nRBC: 0 % (ref 0.0–0.2)

## 2024-02-07 LAB — I-STAT CHEM 8, ED
BUN: 12 mg/dL (ref 6–20)
Calcium, Ion: 1.11 mmol/L — ABNORMAL LOW (ref 1.15–1.40)
Chloride: 105 mmol/L (ref 98–111)
Creatinine, Ser: 1.1 mg/dL (ref 0.61–1.24)
Glucose, Bld: 129 mg/dL — ABNORMAL HIGH (ref 70–99)
HCT: 41 % (ref 39.0–52.0)
Hemoglobin: 13.9 g/dL (ref 13.0–17.0)
Potassium: 3 mmol/L — ABNORMAL LOW (ref 3.5–5.1)
Sodium: 139 mmol/L (ref 135–145)
TCO2: 20 mmol/L — ABNORMAL LOW (ref 22–32)

## 2024-02-07 LAB — PROTIME-INR
INR: 1.1 (ref 0.8–1.2)
Prothrombin Time: 14 s (ref 11.4–15.2)

## 2024-02-07 LAB — COMPREHENSIVE METABOLIC PANEL WITH GFR
ALT: 35 U/L (ref 0–44)
AST: 37 U/L (ref 15–41)
Albumin: 3.4 g/dL — ABNORMAL LOW (ref 3.5–5.0)
Alkaline Phosphatase: 49 U/L (ref 38–126)
Anion gap: 10 (ref 5–15)
BUN: 12 mg/dL (ref 6–20)
CO2: 20 mmol/L — ABNORMAL LOW (ref 22–32)
Calcium: 8.5 mg/dL — ABNORMAL LOW (ref 8.9–10.3)
Chloride: 104 mmol/L (ref 98–111)
Creatinine, Ser: 1.07 mg/dL (ref 0.61–1.24)
GFR, Estimated: >60 mL/min (ref >60)
Glucose, Bld: 129 mg/dL — ABNORMAL HIGH (ref 70–99)
Potassium: 3 mmol/L — ABNORMAL LOW (ref 3.5–5.1)
Sodium: 134 mmol/L — ABNORMAL LOW (ref 135–145)
Total Bilirubin: 0.3 mg/dL (ref 0.0–1.2)
Total Protein: 6.4 g/dL — ABNORMAL LOW (ref 6.5–8.1)

## 2024-02-07 LAB — I-STAT CG4 LACTIC ACID, ED: Lactic Acid, Venous: 1 mmol/L (ref 0.5–1.9)

## 2024-02-07 MED ORDER — IOHEXOL 350 MG/ML SOLN
75.0000 mL | Freq: Once | INTRAVENOUS | Status: AC | PRN
  Administered 2024-02-07: 75 mL via INTRAVENOUS

## 2024-02-07 MED ORDER — LACTATED RINGERS IV BOLUS (SEPSIS)
1000.0000 mL | Freq: Once | INTRAVENOUS | Status: AC
  Administered 2024-02-07: 1000 mL via INTRAVENOUS

## 2024-02-07 MED ORDER — LACTATED RINGERS IV BOLUS: 1000.0000 mL | Freq: Once | INTRAVENOUS | Status: DC

## 2024-02-07 MED ORDER — SODIUM CHLORIDE 0.9 % IV SOLN
2.0000 g | Freq: Once | INTRAVENOUS | Status: AC
  Administered 2024-02-07: 2 g via INTRAVENOUS
  Filled 2024-02-07: qty 12.5

## 2024-02-07 MED ORDER — LACTATED RINGERS IV SOLN: INTRAVENOUS | Status: DC

## 2024-02-07 MED ORDER — VANCOMYCIN HCL IN DEXTROSE 1-5 GM/200ML-% IV SOLN: 1000.0000 mg | Freq: Once | INTRAVENOUS | Status: DC

## 2024-02-07 MED ORDER — METRONIDAZOLE 500 MG/100ML IV SOLN
500.0000 mg | Freq: Once | INTRAVENOUS | Status: AC
  Administered 2024-02-07: 500 mg via INTRAVENOUS
  Filled 2024-02-07: qty 100

## 2024-02-07 MED ORDER — ACETAMINOPHEN 500 MG PO TABS
1000.0000 mg | ORAL_TABLET | Freq: Once | ORAL | Status: AC
  Administered 2024-02-07: 1000 mg via ORAL
  Filled 2024-02-07: qty 2

## 2024-02-07 MED ORDER — LACTATED RINGERS IV BOLUS (SEPSIS)
250.0000 mL | Freq: Once | INTRAVENOUS | Status: AC
  Administered 2024-02-07: 250 mL via INTRAVENOUS

## 2024-02-07 MED ORDER — VANCOMYCIN HCL 1500 MG/300ML IV SOLN
1500.0000 mg | Freq: Once | INTRAVENOUS | Status: DC
  Filled 2024-02-07: qty 300

## 2024-02-07 MED ORDER — DOXYCYCLINE HYCLATE 100 MG PO CAPS: 100.0000 mg | ORAL_CAPSULE | Freq: Two times a day (BID) | ORAL | 0 refills | Status: AC

## 2024-02-07 NOTE — ED Provider Notes (Signed)
 [facility] AT [facility] Provider Note   CSN: [id number] Arrival date & time: 02/07/24  0203     History  Chief Complaint  Patient presents with   Flank Pain    [name] is a 38 y.o. male.  Patient with past medical history significant for asthma presents emergency department complaining of left-sided flank pain which began Friday.  He was seen Friday at an urgent care and was diagnosed with back pain.  It was felt this was likely musculoskeletal in nature with range of motion worsening symptoms, patient endorsing lifting heavy things at work.  Patient was administered a dose of IM Decadron  at the urgent care clinic and prescribed 600 mg ibuprofen  and a muscle relaxant.  The patient states that this evening he was leaving a restaurant when he began to have severe chills.  His wife checked his temperature at home and it was noted to be 102 F.  At 1:15 AM he took a 600 mg ibuprofen .  At the time of arrival at the emergency department at 211 he had a temperature of 103.3 F with a heart rate of 135 and a respiratory rate of 28.  Code sepsis has been activated.  He denies nausea, vomiting.  He does endorse urinary frequency and left-sided flank pain.  No reported chest pain or shortness of breath.   Flank Pain       Home Medications Prior to Admission medications   Medication Sig Start Date End Date Taking? Authorizing Provider  doxycycline  (VIBRAMYCIN ) 100 MG capsule Take 1 capsule (100 mg total) by mouth 2 (two) times daily for 5 days. 02/07/24 02/12/24 Yes Elisa Guest, PA-C  methocarbamol  (ROBAXIN ) 500 MG tablet Take 1 tablet (500 mg total) by mouth 2 (two) times daily. 02/05/24   Harlow Lighter, [place]  N, FNP  diphenhydrAMINE  (BENADRYL ) 25 MG tablet Take 1 tablet (25 mg total) by mouth every 6 (six) hours as needed for itching (Rash). Patient not taking: Reported on 08/28/2014 03/24/14 01/23/20  Carleton Cheek, PA-C      Allergies    Patient has no  known allergies.    Review of Systems   Review of Systems  Genitourinary:  Positive for flank pain.    Physical Exam Updated Vital Signs BP 102/64   Pulse 97   Temp (!) 103.3 F (39.6 C)   Resp 20   Ht 5\' 8"  (1.727 m)   Wt 74.8 kg   SpO2 97%   BMI 25.07 kg/m  Physical Exam Vitals and nursing note reviewed.  Constitutional:      General: He is not in acute distress.    Appearance: He is well-developed.  HENT:     Head: Normocephalic and atraumatic.  Eyes:     Conjunctiva/sclera: Conjunctivae normal.  Cardiovascular:     Rate and Rhythm: Regular rhythm. Tachycardia present.  Pulmonary:     Effort: Pulmonary effort is normal. No respiratory distress.     Breath sounds: Normal breath sounds.  Chest:     Chest wall: No tenderness.  Abdominal:     Palpations: Abdomen is soft.     Tenderness: There is no abdominal tenderness.     Comments: Left sided flank TTP  Musculoskeletal:        General: No swelling.     Cervical back: Neck supple.  Skin:    General: Skin is warm and dry.     Capillary Refill: Capillary refill takes less than 2 seconds.  Neurological:  Mental Status: He is alert.  Psychiatric:        Mood and Affect: Mood normal.     ED Results / Procedures / Treatments   Labs (all labs ordered are listed, but only abnormal results are displayed) Labs Reviewed  COMPREHENSIVE METABOLIC PANEL WITH GFR - Abnormal; Notable for the following components:      Result Value   Sodium 134 (*)    Potassium 3.0 (*)    CO2 20 (*)    Glucose, Bld 129 (*)    Calcium 8.5 (*)    Total Protein 6.4 (*)    Albumin 3.4 (*)    All other components within normal limits  CBC WITH DIFFERENTIAL/PLATELET - Abnormal; Notable for the following components:   WBC 21.2 (*)    Platelets 143 (*)    Neutro Abs 17.0 (*)    Monocytes Absolute 1.6 (*)    Abs Immature Granulocytes 0.26 (*)    All other components within normal limits  URINALYSIS, W/ REFLEX TO CULTURE (INFECTION  SUSPECTED) - Abnormal; Notable for the following components:   Hgb urine dipstick SMALL (*)    All other components within normal limits  I-STAT CHEM 8, ED - Abnormal; Notable for the following components:   Potassium 3.0 (*)    Glucose, Bld 129 (*)    Calcium, Ion 1.11 (*)    TCO2 20 (*)    All other components within normal limits  RESP PANEL BY RT-PCR (RSV, FLU A&B, COVID)  RVPGX2  CULTURE, BLOOD (ROUTINE X 2)  CULTURE, BLOOD (ROUTINE X 2)  PROTIME-INR  I-STAT CG4 LACTIC ACID, ED    EKG None  Radiology CT ABDOMEN PELVIS W CONTRAST Result Date: 02/07/2024 CLINICAL DATA:  Sepsis EXAM: CT ABDOMEN AND PELVIS WITH CONTRAST TECHNIQUE: Multidetector CT imaging of the abdomen and pelvis was performed using the standard protocol following bolus administration of intravenous contrast. RADIATION DOSE REDUCTION: This exam was performed according to the departmental dose-optimization program which includes automated exposure control, adjustment of the mA and/or kV according to patient size and/or use of iterative reconstruction technique. CONTRAST:  75mL OMNIPAQUE IOHEXOL 350 MG/ML SOLN COMPARISON:  None Available. FINDINGS: Lower chest: Dense consolidation in the left lower lobe. No pleural fluid discrete necrosis. Hepatobiliary: No focal liver abnormality. Evidence of biliary obstruction or stone. Pancreas: Unremarkable. Spleen: Unremarkable. Adrenals/Urinary Tract: Negative adrenals. No hydronephrosis or stone. Unremarkable bladder. Stomach/Bowel:  No obstruction. No appendicitis. Vascular/Lymphatic: No acute vascular abnormality. No mass or adenopathy. Reproductive:No pathologic findings. Other: No ascites or pneumoperitoneum. Fatty enlargement of the left inguinal canal may be hernia. Musculoskeletal: No acute abnormalities. IMPRESSION: Left lower lobe pneumonia. No acute intra-abdominal finding. Electronically Signed   By: Ronnette Coke M.D.   On: 02/07/2024 05:12   DG Chest 2 View Result Date:  02/07/2024 CLINICAL DATA:  Suspected sepsis EXAM: CHEST - 2 VIEW COMPARISON:  06/14/2015 FINDINGS: Left lower lobe opacification adjacent to pleura posteriorly. History suggests pneumonia. No pleural fluid or visible cavitation. Mild streaky opacity at the right base attributed to atelectasis. Normal heart size and mediastinal contours. IMPRESSION: Left lower lobe pneumonia. Electronically Signed   By: Ronnette Coke M.D.   On: 02/07/2024 04:02    Procedures Procedures    Medications Ordered in ED Medications  acetaminophen  (TYLENOL ) tablet 1,000 mg (1,000 mg Oral Given 02/07/24 0341)  lactated ringers bolus 1,000 mL (0 mLs Intravenous Stopped 02/07/24 0500)    And  lactated ringers bolus 1,000 mL (0 mLs Intravenous Stopped 02/07/24  0501)    And  lactated ringers bolus 250 mL (0 mLs Intravenous Stopped 02/07/24 0508)  ceFEPIme (MAXIPIME) 2 g in sodium chloride 0.9 % 100 mL IVPB (0 g Intravenous Stopped 02/07/24 0414)  metroNIDAZOLE (FLAGYL) IVPB 500 mg (0 mg Intravenous Stopped 02/07/24 0524)  iohexol (OMNIPAQUE) 350 MG/ML injection 75 mL (75 mLs Intravenous Contrast Given 02/07/24 0425)    ED Course/ Medical Decision Making/ A&P                                 Medical Decision Making Amount and/or Complexity of Data Reviewed Radiology: ordered.  Risk OTC drugs. Prescription drug management.   This patient presents to the ED for concern of bodyaches, chills, left-sided flank pain, this involves an extensive number of treatment options, and is a complaint that carries with it a high risk of complications and morbidity.  The differential diagnosis includes nephrolithiasis, hydronephrosis, pyelonephritis, pneumonia, sepsis, cystitis, others   Co morbidities that complicate the patient evaluation  Asthma   Additional history obtained:  Additional history obtained from family at bedside External records from outside source obtained and reviewed including urgent care notes   Lab  Tests:  I Ordered, and personally interpreted labs.  The pertinent results include: Leukocytosis with a white count of 21,200 (unclear significance this patient had Decadron  injection yesterday), mild hypokalemia with a potassium of 3.0   Imaging Studies ordered:  I ordered imaging studies including CT abdomen pelvis with contrast, chest x-ray I independently visualized and interpreted imaging which showed left lower lobe pneumonia I agree with the radiologist interpretation   Problem List / ED Course / Critical interventions / Medication management   I ordered medication including Flagyl and cefepime, LR bolus, Tylenol  for sepsis criteria Reevaluation of the patient after these medicines showed that the patient improved I have reviewed the patients home medicines and have made adjustments as needed   Social Determinants of Health:  Sepsis   Test / Admission - Considered:  Initial workup concerning with positive SIRS criteria, possible sepsis.  Code sepsis was activated.  Broad-spectrum antibiotics were initiated.  Patient workup significant for left lower lobe pneumonia.   Vital stabilized after Tylenol  and fluids.  Oxygen saturations have remained above 97% on room air.  He is not short of breath.  At this time no indication for further emergent workup.Plan to discharge home with prescription for doxycycline  for community-acquired pneumonia.  Return precautions provided.         Final Clinical Impression(s) / ED Diagnoses Final diagnoses:  Community acquired pneumonia of left lower lobe of lung    Rx / DC Orders ED Discharge Orders          Ordered    doxycycline  (VIBRAMYCIN ) 100 MG capsule  2 times daily        02/07/24 0532              Elisa Guest, PA-C 02/07/24 0537    Alissa April, MD 02/07/24 [phone]

## 2024-02-07 NOTE — Sepsis Progress Note (Signed)
 Following for sepsis monitoring ?

## 2024-02-07 NOTE — ED Triage Notes (Cosign Needed)
 The pt is c/o lt flank pain since Friday  he  was seen at [facility] and given muscle relaxer and steroids this afternoon he began to have chills at 1800 temp was 102.7 2200 t 100.6  0115 tempt back u to 103.4 temp med given  temp still 103  urinary frequency

## 2024-02-07 NOTE — Discharge Instructions (Addendum)
 Please take the prescribed antibiotics until complete.  It is typically recommended to get a repeat chest x-ray in 3 to 4 weeks to check for resolution of pneumonia on imaging.  If you have worsening symptoms or develop other life-threatening symptoms please return to the emergency department.

## 2024-02-07 NOTE — ED Provider Notes (Deleted)
 38 year old male with history of asthma developed left flank pain on 5/9, developed fever and chills on the evening of 5/10.  On exam, he is nontoxic in appearance but has significant left CVA tenderness.  Labs are significant for hypokalemia and marked leukocytosis.  Urinalysis is unremarkable.  Chest x-ray shows left lower lobe pneumonia which may account for his symptoms.  CT of abdomen pelvis is pending.   Alissa April, MD 02/07/24 [phone]

## 2024-02-09 ENCOUNTER — Encounter (HOSPITAL_COMMUNITY): Payer: Self-pay | Admitting: Pharmacy Technician

## 2024-02-09 ENCOUNTER — Emergency Department (HOSPITAL_COMMUNITY): Payer: Self-pay

## 2024-02-09 ENCOUNTER — Other Ambulatory Visit: Payer: Self-pay

## 2024-02-09 ENCOUNTER — Observation Stay (HOSPITAL_COMMUNITY)
Admission: EM | Admit: 2024-02-09 | Discharge: 2024-02-10 | Disposition: A | Discharge status: Home / Self Care | Payer: Self-pay | Attending: Internal Medicine | Admitting: Internal Medicine

## 2024-02-09 DIAGNOSIS — R109 Unspecified abdominal pain: Secondary | ICD-10-CM | POA: Insufficient documentation

## 2024-02-09 DIAGNOSIS — J189 Pneumonia, unspecified organism: Principal | ICD-10-CM

## 2024-02-09 DIAGNOSIS — J181 Lobar pneumonia, unspecified organism: Principal | ICD-10-CM | POA: Insufficient documentation

## 2024-02-09 DIAGNOSIS — J159 Unspecified bacterial pneumonia: Secondary | ICD-10-CM | POA: Diagnosis present

## 2024-02-09 DIAGNOSIS — M62830 Muscle spasm of back: Secondary | ICD-10-CM | POA: Insufficient documentation

## 2024-02-09 DIAGNOSIS — R1032 Left lower quadrant pain: Secondary | ICD-10-CM | POA: Insufficient documentation

## 2024-02-09 DIAGNOSIS — F17210 Nicotine dependence, cigarettes, uncomplicated: Secondary | ICD-10-CM | POA: Insufficient documentation

## 2024-02-09 DIAGNOSIS — I517 Cardiomegaly: Secondary | ICD-10-CM | POA: Insufficient documentation

## 2024-02-09 LAB — CBC
HCT: 36.9 % — ABNORMAL LOW (ref 39.0–52.0)
Hemoglobin: 12.4 g/dL — ABNORMAL LOW (ref 13.0–17.0)
MCH: 30.2 pg (ref 26.0–34.0)
MCHC: 33.6 g/dL (ref 30.0–36.0)
MCV: 90 fL (ref 80.0–100.0)
Platelets: 172 10*3/uL (ref 150–400)
RBC: 4.1 MIL/uL — ABNORMAL LOW (ref 4.22–5.81)
RDW: 13.7 % (ref 11.5–15.5)
WBC: 17.6 10*3/uL — ABNORMAL HIGH (ref 4.0–10.5)
nRBC: 0 % (ref 0.0–0.2)

## 2024-02-09 LAB — BASIC METABOLIC PANEL WITH GFR
Anion gap: 8 (ref 5–15)
BUN: 7 mg/dL (ref 6–20)
CO2: 24 mmol/L (ref 22–32)
Calcium: 8.7 mg/dL — ABNORMAL LOW (ref 8.9–10.3)
Chloride: 104 mmol/L (ref 98–111)
Creatinine, Ser: 0.95 mg/dL (ref 0.61–1.24)
GFR, Estimated: >60 mL/min (ref >60)
Glucose, Bld: 106 mg/dL — ABNORMAL HIGH (ref 70–99)
Potassium: 3.4 mmol/L — ABNORMAL LOW (ref 3.5–5.1)
Sodium: 136 mmol/L (ref 135–145)

## 2024-02-09 LAB — I-STAT CG4 LACTIC ACID, ED: Lactic Acid, Venous: 1 mmol/L (ref 0.5–1.9)

## 2024-02-09 MED ORDER — IBUPROFEN 200 MG PO TABS
400.0000 mg | ORAL_TABLET | Freq: Four times a day (QID) | ORAL | Status: DC | PRN
  Administered 2024-02-09 – 2024-02-10 (×2): 400 mg via ORAL
  Filled 2024-02-09 (×2): qty 2

## 2024-02-09 MED ORDER — SENNA 8.6 MG PO TABS: 1.0000 | ORAL_TABLET | Freq: Every evening | ORAL | Status: DC | PRN

## 2024-02-09 MED ORDER — IOHEXOL 350 MG/ML SOLN
75.0000 mL | Freq: Once | INTRAVENOUS | Status: AC | PRN
  Administered 2024-02-09: 75 mL via INTRAVENOUS

## 2024-02-09 MED ORDER — RIVAROXABAN 10 MG PO TABS
10.0000 mg | ORAL_TABLET | Freq: Every day | ORAL | Status: DC
  Administered 2024-02-10: 10 mg via ORAL
  Filled 2024-02-09: qty 1

## 2024-02-09 MED ORDER — POTASSIUM CHLORIDE CRYS ER 20 MEQ PO TBCR
40.0000 meq | EXTENDED_RELEASE_TABLET | Freq: Once | ORAL | Status: AC
  Administered 2024-02-09: 40 meq via ORAL
  Filled 2024-02-09: qty 2

## 2024-02-09 MED ORDER — ACETAMINOPHEN 325 MG PO TABS
650.0000 mg | ORAL_TABLET | Freq: Four times a day (QID) | ORAL | Status: DC
  Administered 2024-02-09: 650 mg via ORAL
  Filled 2024-02-09 (×3): qty 2

## 2024-02-09 MED ORDER — POLYETHYLENE GLYCOL 3350 17 G PO PACK: 17.0000 g | PACK | Freq: Every day | ORAL | Status: DC | PRN

## 2024-02-09 MED ORDER — SODIUM CHLORIDE 0.9 % IV SOLN
1.0000 g | INTRAVENOUS | Status: DC
  Administered 2024-02-10: 1 g via INTRAVENOUS
  Filled 2024-02-09: qty 10

## 2024-02-09 MED ORDER — SODIUM CHLORIDE 0.9 % IV SOLN
1.0000 g | Freq: Once | INTRAVENOUS | Status: AC
  Administered 2024-02-09: 1 g via INTRAVENOUS
  Filled 2024-02-09: qty 10

## 2024-02-09 MED ORDER — SODIUM CHLORIDE 0.9 % IV SOLN: 500.0000 mg | INTRAVENOUS | Status: DC

## 2024-02-09 MED ORDER — SODIUM CHLORIDE 0.9 % IV SOLN
500.0000 mg | Freq: Once | INTRAVENOUS | Status: AC
  Administered 2024-02-09: 500 mg via INTRAVENOUS
  Filled 2024-02-09: qty 5

## 2024-02-09 NOTE — ED Notes (Signed)
 Called [id number] to advise coming up!

## 2024-02-09 NOTE — ED Triage Notes (Signed)
 Pt here via POV with reports of chills, sweats. Dx with [facility] on Friday and started on abx. States he just doesn't feel like the medication is working.

## 2024-02-09 NOTE — ED Notes (Signed)
 Patient transported to CT

## 2024-02-09 NOTE — ED Provider Notes (Signed)
  EMERGENCY DEPARTMENT AT [facility] Provider Note   CSN: [id number] Arrival date & time: 02/09/24  1416    History  Chief Complaint  Patient presents with   Pneumonia    [name] [name] is a 38 y.o. male here for evaluation of fever.  Seen in ED 3 days ago for left flank pain, diagnosed with pneumonia.  He was given IV cefepime and Flagyl.  Subsequently discharged home on doxycycline .  Patient states he has been compliant with his medications.  He is still having fevers, most recent 102 today.  Feels poorly.  Has some pleuritic chest pain on the left, intermittent shortness of breath and cough without hemoptysis.  No history of PE or DVT.  No lower extremity edema, recent surgery, immobilization or malignancy  HPI     Home Medications Prior to Admission medications   Medication Sig Start Date End Date Taking? Authorizing Provider  amoxicillin  (AMOXIL ) 500 MG tablet Take 500 mg by mouth every 8 (eight) hours. 02/03/24  Yes [provider]  doxycycline  (VIBRAMYCIN ) 100 MG capsule Take 1 capsule (100 mg total) by mouth 2 (two) times daily for 5 days. 02/07/24 02/12/24 Yes Elisa Guest, PA-C  ibuprofen  (ADVIL ) 600 MG tablet Take 600 mg by mouth every 8 (eight) hours as needed. 02/03/24  Yes [provider]  methocarbamol  (ROBAXIN ) 500 MG tablet Take 1 tablet (500 mg total) by mouth 2 (two) times daily. 02/05/24  Yes Garrison, [place]  N, FNP  diphenhydrAMINE  (BENADRYL ) 25 MG tablet Take 1 tablet (25 mg total) by mouth every 6 (six) hours as needed for itching (Rash). Patient not taking: Reported on 08/28/2014 03/24/14 01/23/20  Carleton Cheek, PA-C      Allergies    Patient has no known allergies.    Review of Systems   Review of Systems  Constitutional:  Positive for fatigue and fever.  HENT: Negative.    Respiratory:  Positive for cough and shortness of breath.   Cardiovascular:  Negative for chest pain, palpitations and leg swelling.   Gastrointestinal: Negative.   Genitourinary: Negative.   Musculoskeletal: Negative.   Skin: Negative.   Neurological:  Positive for weakness (generalized).  All other systems reviewed and are negative.   Physical Exam Updated Vital Signs BP 130/73 (BP Location: Left Arm)   Pulse 73   Temp 98.6 F (37 C) (Oral)   Resp 18   SpO2 96%  Physical Exam Vitals and nursing note reviewed.  Constitutional:      General: He is not in acute distress.    Appearance: He is well-developed. He is not ill-appearing, toxic-appearing or diaphoretic.  HENT:     Head: Normocephalic and atraumatic.     Nose: Nose normal.     Mouth/Throat:     Mouth: Mucous membranes are moist.  Eyes:     Pupils: Pupils are equal, round, and reactive to light.  Cardiovascular:     Rate and Rhythm: Normal rate and regular rhythm.  Pulmonary:     Effort: Pulmonary effort is normal. No respiratory distress.     Breath sounds: Normal breath sounds and air entry.     Comments: Coarse lung sounds left lower lobe Abdominal:     General: Bowel sounds are normal. There is no distension.     Palpations: Abdomen is soft.     Tenderness: There is no abdominal tenderness. There is no right CVA tenderness, left CVA tenderness or guarding.  Musculoskeletal:  General: No swelling, tenderness, deformity or signs of injury. Normal range of motion.     Cervical back: Normal range of motion and neck supple.     Right lower leg: No edema.     Left lower leg: No edema.  Skin:    General: Skin is warm and dry.  Neurological:     General: No focal deficit present.     Mental Status: He is alert and oriented to person, place, and time.     ED Results / Procedures / Treatments   Labs (all labs ordered are listed, but only abnormal results are displayed) Labs Reviewed  BASIC METABOLIC PANEL WITH GFR - Abnormal; Notable for the following components:      Result Value   Potassium 3.4 (*)    Glucose, Bld 106 (*)     Calcium 8.7 (*)    All other components within normal limits  CBC - Abnormal; Notable for the following components:   WBC 17.6 (*)    RBC 4.10 (*)    Hemoglobin 12.4 (*)    HCT 36.9 (*)    All other components within normal limits  CULTURE, BLOOD (ROUTINE X 2)  CULTURE, BLOOD (ROUTINE X 2)  MRSA NEXT GEN BY PCR, NASAL  HIV ANTIBODY (ROUTINE TESTING W REFLEX)  I-STAT CG4 LACTIC ACID, ED    EKG None  Radiology CT Angio Chest PE W and/or Wo Contrast Result Date: 02/09/2024 CLINICAL DATA:  Shortness of breath and chest pain, left lower lobe airspace opacity/pneumonia EXAM: CT ANGIOGRAPHY CHEST WITH CONTRAST TECHNIQUE: Multidetector CT imaging of the chest was performed using the standard protocol during bolus administration of intravenous contrast. Multiplanar CT image reconstructions and MIPs were obtained to evaluate the vascular anatomy. RADIATION DOSE REDUCTION: This exam was performed according to the departmental dose-optimization program which includes automated exposure control, adjustment of the mA and/or kV according to patient size and/or use of iterative reconstruction technique. CONTRAST:  75mL OMNIPAQUE IOHEXOL 350 MG/ML SOLN COMPARISON:  02/09/2024 chest radiograph FINDINGS: Cardiovascular: No filling defect is identified in the pulmonary arterial tree to suggest pulmonary embolus. Mild cardiomegaly. Mediastinum/Nodes: AP window lymph node 1.0 cm in short axis on image 46 series 5. Left hilar node 1.1 cm in short axis image 59 series 5. Subcarinal node 1.1 cm in short axis image 57 series 5. Left infrahilar node 1.1 cm in short axis image 68 series 5. Additional small mediastinal lymph nodes are present. Lungs/Pleura: Dense consolidation throughout much of the left lower lobe favoring bacterial pattern acute pneumonia. Mild paraseptal emphysema. Mild bilateral airway thickening which may reflect bronchitis or reactive airways disease. Mild scarring or atelectasis in the lingula. Trace  right pleural effusion. No significant parapneumonic or left pleural effusion. Upper Abdomen: Unremarkable Musculoskeletal: Unremarkable Review of the MIP images confirms the above findings. IMPRESSION: 1. No filling defect is identified in the pulmonary arterial tree to suggest pulmonary embolus. 2. Dense consolidation throughout much of the left lower lobe favoring bacterial pattern acute pneumonia. 3. Mild mediastinal and left hilar/infrahilar adenopathy, likely reactive in this setting. 4. Mild bilateral airway thickening which may reflect bronchitis or reactive airways disease. 5. Minimal paraseptal emphysema. 6. Mild cardiomegaly. 7. Trace right pleural effusion. Electronically Signed   By: Freida Jes M.D.   On: 02/09/2024 17:54   DG Chest 2 View Result Date: 02/09/2024 CLINICAL DATA:  Shortness of breath EXAM: CHEST - 2 VIEW COMPARISON:  Chest radiograph dated 02/07/2024 FINDINGS: Increased left lower lobe consolidation. Blunting of  the left costophrenic angle. No pneumothorax. The heart size and mediastinal contours are within normal limits. No acute osseous abnormality. IMPRESSION: 1. Increased left lower lobe pneumonia. 2. Blunting of the left costophrenic angle, which may represent a small pleural effusion. Electronically Signed   By: Limin  Xu M.D.   On: 02/09/2024 15:10    Procedures Procedures    Medications Ordered in ED Medications  rivaroxaban (XARELTO) tablet 10 mg (has no administration in time range)  acetaminophen  (TYLENOL ) tablet 650 mg (has no administration in time range)  ibuprofen  (ADVIL ) tablet 400 mg (has no administration in time range)  cefTRIAXone (ROCEPHIN) 1 g in sodium chloride 0.9 % 100 mL IVPB (has no administration in time range)  azithromycin (ZITHROMAX) 500 mg in sodium chloride 0.9 % 250 mL IVPB (has no administration in time range)  cefTRIAXone (ROCEPHIN) 1 g in sodium chloride 0.9 % 100 mL IVPB (0 g Intravenous Stopped 02/09/24 1801)  azithromycin  (ZITHROMAX) 500 mg in sodium chloride 0.9 % 250 mL IVPB (0 mg Intravenous Stopped 02/09/24 1828)  iohexol (OMNIPAQUE) 350 MG/ML injection 75 mL (75 mLs Intravenous Contrast Given 02/09/24 1746)   ED Course/ Medical Decision Making/ A&P Clinical Course as of 02/09/24 2110  Tue Feb 09, 2024  1844 IM teaching to see for admission [BH]    Clinical Course User Index [BH] Georgean Spainhower A, PA-C   38 year old here for evaluation of persistent fever and cough.  He was seen 3 days ago diagnosed with pneumonia started on doxycycline  after getting cefepime and Flagyl in the ED.  Returns today due to persistently feeling poor.  He has no hypoxia, tachycardia or tachypnea.  Coarse lung sounds left lower lobe.  Plan on labs, imaging and reassess  Labs and imaging personally viewed and interpret:  CBC leukocytosis 17.6 down from 21 2 days ago Metabolic panel potassium 3.4 Lactic 1.0 Chest x-ray shows worsening left lower lobe pneumonia  Will start on Rocephin, azithromycin.  Will get CTA.  Patient reassessed.  CTA shows dense pneumonia left lobe.  Will admit for failed outpatient treatment of PNA, given worsening on chest x-ray and persistent fevers at home.  Patient and family agreeable  Consult with IM teaching who was agreeable to evaluate patient for admission  The patient appears reasonably stabilized for admission considering the current resources, flow, and capabilities available in the ED at this time, and I doubt any other [facility] requiring further screening and/or treatment in the ED prior to admission.                                 Medical Decision Making Amount and/or Complexity of Data Reviewed Independent Historian: spouse External Data Reviewed: labs, radiology, ECG and notes. Labs: ordered. Decision-making details documented in ED Course. Radiology: ordered and independent interpretation performed. Decision-making details documented in ED Course. ECG/medicine tests: ordered and  independent interpretation performed. Decision-making details documented in ED Course.  Risk OTC drugs. Prescription drug management. Parenteral controlled substances. Decision regarding hospitalization. Diagnosis or treatment significantly limited by social determinants of health.          Final Clinical Impression(s) / ED Diagnoses Final diagnoses:  Community acquired pneumonia of left lower lobe of lung    Rx / DC Orders ED Discharge Orders     None         Aleksander Edmiston A, PA-C 02/09/24 2110    Karlyn Overman, MD 02/10/24 1207

## 2024-02-09 NOTE — H&P (Cosign Needed Addendum)
 Date: 02/09/2024               Patient Name:  [name] [name] MRN: [id number]  DOB: April 25, 1986 Age / Sex: 38 y.o., male   PCP: Patient, No Pcp Per         Medical Service: Internal Medicine Teaching Service         Attending Physician: Dr. Priscella Brooms, DO      First Contact: Dr. Dorthy Gavia, MD     Second Contact: Dr. Jearldine Mina, DO          After Hours (After 5p/  First Contact Pager: [phone]  weekends / holidays): Second Contact Pager: [phone]   SUBJECTIVE   Chief Complaint: Fever, feeling worse   History of Present Illness: [name] [name] is a 38 year old male without a significant past medical history of who presents to the emergency department for feeling worse since starting doxycycline  for community-acquired pneumonia on Sunday.  Patient prefer that his wife provided the history.  She stated that he started to have left-sided flank pain on Friday and went to an urgent care on Saturday where he was diagnosed with a back spasm.  Then on Saturday night, he had a fever of 102 and they came into the emergency department Sunday morning where he was diagnosed with bacterial pneumonia and sent home with doxycycline .  Patient reports that he started a cough around 2 days ago on Sunday and has not had any shortness of breath with that.  Since he was discharged emergency department on Sunday, he has not missed a dose of doxycycline  and has progressively felt worse.  He continues to have fevers, a nonproductive cough, headaches, and feeling overall weak.  He denies chest pain, shortness of breath, nausea, vomiting, orthopnea, lower extremity edema, or dysuria.  He does report urinating more frequently today but this is because he was drinking more water, he stated that this is the normal amount that he urinates.   Review of Systems: A complete ROS was negative except as per HPI.   ED Course: In the emergency department, patient was afebrile and saturating at 98% on room air.   And laboratory work: White blood count 17.6, blood cultures collected, lactic acid 1.0.  Chest x-ray did show an increased left lower lobe pneumonia and blunting of the left costophrenic angle which may represent a small pleural effusion.  CT angio of the chest did not reveal a pulmonary emboli, but did reveal dense consolidation throughout the left lower lobe with left hilar adenopathy and mild bilateral airway thickening.  On CT, there is also evidence of mild cardiomegaly and trace right pleural effusion.   Past Medical History: Patient denies a significant past medical history  Meds:  On a regular basis, patient does not take any medications but has been taking ibuprofen  and Tylenol  regularly since feeling sick Allergies: Allergies as of 02/09/2024   (No Known Allergies)    History reviewed. No pertinent surgical history.  Social:  Lives With: Wife and children at home Level of Function: Independent PCP: No PCP Substances: Reports a 34-pack-year history of black and milds, recently quit smoking on Sunday, denies alcohol or current illicit substance use, patient last used marijuana about 3 years ago, he denies IV drug administration    OBJECTIVE:   Physical Exam: Blood pressure 130/73, pulse 73, temperature 98.6 F (37 C), temperature source Oral, resp. rate 18, SpO2 96%.  Constitutional: Ill appearing, laying in bed with Wife at bedside  HENT:  normocephalic atraumatic Cardiovascular: regular rate and rhythm, no m/r/g Pulmonary/Chest: normal work of breathing on room air, inspiratory rhonchi present on the left lower lobe predominantly, inspiratory rhonchi also present in the right lower lobe Abdominal: soft, bilateral flank tenderness to palpation Neurological: alert & oriented x 3 MSK: no gross abnormalities. No pitting edema Skin: warm and dry Psych: Normal mood and affect  Labs:    Latest Ref Rng & Units 02/09/2024    2:24 PM 02/07/2024    2:44 AM 02/07/2024    2:35 AM   CBC  WBC 4.0 - 10.5 K/uL 17.6   21.2   Hemoglobin 13.0 - 17.0 g/dL 30.8  65.7  84.6   Hematocrit 39.0 - 52.0 % 36.9  41.0  39.4   Platelets 150 - 400 K/uL 172   143         Latest Ref Rng & Units 02/09/2024    2:24 PM 02/07/2024    2:44 AM 02/07/2024    2:35 AM  CMP  Glucose 70 - 99 mg/dL 962  952  841   BUN 6 - 20 mg/dL 7  12  12    Creatinine 0.61 - 1.24 mg/dL 3.24  4.01  0.27   Sodium 135 - 145 mmol/L 136  139  134   Potassium 3.5 - 5.1 mmol/L 3.4  3.0  3.0   Chloride 98 - 111 mmol/L 104  105  104   CO2 22 - 32 mmol/L 24   20   Calcium 8.9 - 10.3 mg/dL 8.7   8.5   Total Protein 6.5 - 8.1 g/dL   6.4   Total Bilirubin 0.0 - 1.2 mg/dL   0.3   Alkaline Phos 38 - 126 U/L   49   AST 15 - 41 U/L   37   ALT 0 - 44 U/L   35       Imaging: CT Angio Chest PE W and/or Wo Contrast Result Date: 02/09/2024 CLINICAL DATA:  Shortness of breath and chest pain, left lower lobe airspace opacity/pneumonia EXAM: CT ANGIOGRAPHY CHEST WITH CONTRAST TECHNIQUE: Multidetector CT imaging of the chest was performed using the standard protocol during bolus administration of intravenous contrast. Multiplanar CT image reconstructions and MIPs were obtained to evaluate the vascular anatomy. RADIATION DOSE REDUCTION: This exam was performed according to the departmental dose-optimization program which includes automated exposure control, adjustment of the mA and/or kV according to patient size and/or use of iterative reconstruction technique. CONTRAST:  75mL OMNIPAQUE IOHEXOL 350 MG/ML SOLN COMPARISON:  02/09/2024 chest radiograph FINDINGS: Cardiovascular: No filling defect is identified in the pulmonary arterial tree to suggest pulmonary embolus. Mild cardiomegaly. Mediastinum/Nodes: AP window lymph node 1.0 cm in short axis on image 46 series 5. Left hilar node 1.1 cm in short axis image 59 series 5. Subcarinal node 1.1 cm in short axis image 57 series 5. Left infrahilar node 1.1 cm in short axis image 68 series 5.  Additional small mediastinal lymph nodes are present. Lungs/Pleura: Dense consolidation throughout much of the left lower lobe favoring bacterial pattern acute pneumonia. Mild paraseptal emphysema. Mild bilateral airway thickening which may reflect bronchitis or reactive airways disease. Mild scarring or atelectasis in the lingula. Trace right pleural effusion. No significant parapneumonic or left pleural effusion. Upper Abdomen: Unremarkable Musculoskeletal: Unremarkable Review of the MIP images confirms the above findings. IMPRESSION: 1. No filling defect is identified in the pulmonary arterial tree to suggest pulmonary embolus. 2. Dense consolidation throughout much of the left lower lobe  favoring bacterial pattern acute pneumonia. 3. Mild mediastinal and left hilar/infrahilar adenopathy, likely reactive in this setting. 4. Mild bilateral airway thickening which may reflect bronchitis or reactive airways disease. 5. Minimal paraseptal emphysema. 6. Mild cardiomegaly. 7. Trace right pleural effusion. Electronically Signed   By: Freida Jes M.D.   On: 02/09/2024 17:54   DG Chest 2 View Result Date: 02/09/2024 CLINICAL DATA:  Shortness of breath EXAM: CHEST - 2 VIEW COMPARISON:  Chest radiograph dated 02/07/2024 FINDINGS: Increased left lower lobe consolidation. Blunting of the left costophrenic angle. No pneumothorax. The heart size and mediastinal contours are within normal limits. No acute osseous abnormality. IMPRESSION: 1. Increased left lower lobe pneumonia. 2. Blunting of the left costophrenic angle, which may represent a small pleural effusion. Electronically Signed   By: Limin  Xu M.D.   On: 02/09/2024 15:10      EKG: personally reviewed my interpretation is NSR. Prior EKG shows NSR as well.   ASSESSMENT & PLAN:   Assessment & Plan by Problem: Principal Problem:   Community acquired bacterial pneumonia Active Problems:   Abdominal pain   Mild cardiomegaly   [name] [name] is a 38  y.o. person living with a history of without a significant past medical history of who presents to the emergency department for feeling worse since starting doxycycline  for community-acquired pneumonia on Sunday and admitted for community-acquired pneumonia requiring IV antibiotics on hospital day 0  Community Acquired Pneumonia  Patient arrives emergency department after not responding to outpatient antibiotics to treat the community-acquired pneumonia.  Chest x-ray obtained did reveal an increased left lower lobe pneumonia with CT angiogram findings to support bacterial commune acquired pneumonia.  Patient is taking the doxycycline  as prescribed and denies missing any doses of his medication, I am unsure whether or not we can classify this as failing outpatient antibiotics since he has only taken a full 2 days of antibiotic therapy.  Fortunately, he is not requiring supplemental oxygen nor is he complaining of shortness of breath. Regardless, with patient's symptoms of feeling worse and imaging supporting worsening of the bacterial pneumonia, we will admit him for IV antibiotic therapy.  Other etiologies of infection such as UTI, cellulitis, IVDU were considered, but is lacking physical exam findings and symptoms to support these. Plan: -MRSA nasal swab ordered. Patient has low MRSA risk, but will evaluate -Will continue with Rocephin 1 g daily for 5 days and azithromycin 500 mg for 3 days -Tylenol  650 mg scheduled Q8hr, ibuprofen  400 mg q6 hours prn   Abdominal pain Patient does report bilateral flank/lateral abdominal pain tenderness during palpation.  He denies any pain at rest.  He has not had a bowel movement for the past 2 days and he usually has a bowel movement every day.  Pain may be due to MSK from coughing versus constipation.  Will start a bowel regimen of Senna nightly as needed and MiraLAX daily as needed.  Patient did receive a CT abdomen pelvis with contrast on Sunday, there is no  findings of any acute intra-abdominal pathology at that point. Plan: Bowel regimen: Miralax daily prn, senna nightly prn   Mild Cardiomegaly on Imaging On the CT angiogram of the chest, there is a finding of mild cardiomegaly.  On chest x-ray today and in the past, there is no mention of cardiomegaly.  Patient denies any symptoms of volume overload such as orthopnea, lower extremity edema.  On exam, there is no lower extremity edema.   Diet: Normal VTE: NOAC Code:  Full  Prior to Admission Living Arrangement: Home, living with wife Anticipated Discharge Location: Home Barriers to Discharge: Treatment of CAP   Dispo: Admit patient to Observation with expected length of stay less than 2 midnights.  Signed:   Aurora Lees, DO Internal Medicine Resident PGY-1 02/09/2024, 9:45 PM   Please contact the on call pager at [phone].

## 2024-02-09 NOTE — Progress Notes (Signed)
 MEWS Progress Note  Patient Details Name: [name] [name] MRN: [id number] DOB: 03/24/1986 Today's Date: 02/09/2024   MEWS Flowsheet Documentation:  Assess: MEWS Score Temp: (!) 101.7 F (38.7 C) BP: 136/77 MAP (mmHg): 96 Pulse Rate: 100 Resp: (!) 22 Level of Consciousness: Alert SpO2: 99 % O2 Device: Room Air Assess: MEWS Score MEWS Temp: 2 MEWS Systolic: 0 MEWS Pulse: 0 MEWS RR: 1 MEWS LOC: 0 MEWS Score: 3 MEWS Score Color: Yellow Assess: SIRS CRITERIA SIRS Temperature : 1 SIRS Respirations : 1 SIRS Pulse: 1 SIRS WBC: 1 SIRS Score Sum : 4 SIRS Temperature : 1 SIRS Pulse: 1 SIRS Respirations : 1 SIRS WBC: 1 SIRS Score Sum : 4 Assess: if the MEWS score is Yellow or Red Were vital signs accurate and taken at a resting state?: Yes Does the patient meet 2 or more of the SIRS criteria?: Yes Does the patient have a confirmed or suspected source of infection?: Yes MEWS guidelines implemented : Yes, yellow Treat MEWS Interventions: Considered administering scheduled or prn medications/treatments as ordered Take Vital Signs Increase Vital Sign Frequency : Yellow: Q2hr x1, continue Q4hrs until patient remains green for 12hrs Escalate MEWS: Escalate: Yellow: Discuss with charge nurse and consider notifying provider and/or RRT Notify: Charge Nurse/RN Name of Charge Nurse/RN Notified: Paul,RN Provider Notification Provider Name/Title: Janann Meadow & Goodwin,MD Date Provider Notified: 02/09/24 Time Provider Notified: 2132 Method of Notification:  (secure chat) Notification Reason: Change in status Provider response: No new orders Date of Provider Response: 02/09/24 Time of Provider Response: 2132    ON arrival to the unit patient Yellow muse, charge RN aware and providers notified. Will assess vitals accordingly.   [name] 02/09/2024, 9:32 PM

## 2024-02-10 ENCOUNTER — Other Ambulatory Visit (HOSPITAL_COMMUNITY): Payer: Self-pay

## 2024-02-10 LAB — BASIC METABOLIC PANEL WITH GFR
Anion gap: 8 (ref 5–15)
BUN: 8 mg/dL (ref 6–20)
CO2: 20 mmol/L — ABNORMAL LOW (ref 22–32)
Calcium: 8.2 mg/dL — ABNORMAL LOW (ref 8.9–10.3)
Chloride: 109 mmol/L (ref 98–111)
Creatinine, Ser: 0.82 mg/dL (ref 0.61–1.24)
GFR, Estimated: >60 mL/min (ref >60)
Glucose, Bld: 84 mg/dL (ref 70–99)
Potassium: 3.2 mmol/L — ABNORMAL LOW (ref 3.5–5.1)
Sodium: 137 mmol/L (ref 135–145)

## 2024-02-10 LAB — CBC
HCT: 35.2 % — ABNORMAL LOW (ref 39.0–52.0)
Hemoglobin: 12.2 g/dL — ABNORMAL LOW (ref 13.0–17.0)
MCH: 30.3 pg (ref 26.0–34.0)
MCHC: 34.7 g/dL (ref 30.0–36.0)
MCV: 87.3 fL (ref 80.0–100.0)
Platelets: 188 10*3/uL (ref 150–400)
RBC: 4.03 MIL/uL — ABNORMAL LOW (ref 4.22–5.81)
RDW: 13.7 % (ref 11.5–15.5)
WBC: 12.9 10*3/uL — ABNORMAL HIGH (ref 4.0–10.5)
nRBC: 0 % (ref 0.0–0.2)

## 2024-02-10 LAB — MRSA NEXT GEN BY PCR, NASAL: MRSA by PCR Next Gen: NOT DETECTED

## 2024-02-10 LAB — MAGNESIUM: Magnesium: 1.9 mg/dL (ref 1.7–2.4)

## 2024-02-10 LAB — HIV ANTIBODY (ROUTINE TESTING W REFLEX): HIV Screen 4th Generation wRfx: NONREACTIVE

## 2024-02-10 MED ORDER — LIDOCAINE 5 % EX PTCH
1.0000 | MEDICATED_PATCH | CUTANEOUS | Status: DC
  Administered 2024-02-10: 1 via TRANSDERMAL
  Filled 2024-02-10: qty 1

## 2024-02-10 MED ORDER — METHOCARBAMOL 750 MG PO TABS
1500.0000 mg | ORAL_TABLET | Freq: Once | ORAL | Status: AC
  Administered 2024-02-10: 1500 mg via ORAL
  Filled 2024-02-10: qty 2

## 2024-02-10 MED ORDER — POTASSIUM CHLORIDE 20 MEQ PO PACK
40.0000 meq | PACK | Freq: Once | ORAL | Status: AC
  Administered 2024-02-10: 40 meq via ORAL
  Filled 2024-02-10: qty 2

## 2024-02-10 MED ORDER — METHOCARBAMOL 750 MG PO TABS
750.0000 mg | ORAL_TABLET | Freq: Four times a day (QID) | ORAL | 0 refills | Status: AC
  Filled 2024-02-10: qty 20, 5d supply, fill #0

## 2024-02-10 MED ORDER — AMOXICILLIN-POT CLAVULANATE 875-125 MG PO TABS
1.0000 | ORAL_TABLET | Freq: Two times a day (BID) | ORAL | 0 refills | Status: AC
  Filled 2024-02-10: qty 8, 4d supply, fill #0

## 2024-02-10 MED ORDER — LIDOCAINE 5 % EX PTCH
1.0000 | MEDICATED_PATCH | CUTANEOUS | 0 refills | Status: AC
  Filled 2024-02-10: qty 30, 30d supply, fill #0

## 2024-02-10 MED ORDER — ASPIRIN-ACETAMINOPHEN-CAFFEINE 250-250-65 MG PO TABS
1.0000 | ORAL_TABLET | Freq: Once | ORAL | Status: DC
  Filled 2024-02-10 (×2): qty 1

## 2024-02-10 MED ORDER — ACETAMINOPHEN 500 MG PO TABS: 1000.0000 mg | ORAL_TABLET | Freq: Three times a day (TID) | ORAL | Status: DC

## 2024-02-10 NOTE — H&P (Incomplete)
 Name: [name] [name] MRN: [id number] DOB: August 16, 1986 38 y.o. PCP: Patient, No Pcp Per  Date of Admission: 02/09/2024  2:16 PM Date of Discharge:  02/10/2024 Attending Physician: Dr. Bettejane Brownie  DISCHARGE DIAGNOSIS:  Primary Problem: Community acquired bacterial pneumonia   Hospital Problems: Principal Problem:   Community acquired bacterial pneumonia Active Problems:   Abdominal pain   Mild cardiomegaly    DISCHARGE MEDICATIONS:   Allergies as of 02/10/2024   No Known Allergies      Medication List     STOP taking these medications    amoxicillin  500 MG tablet Commonly known as: AMOXIL        TAKE these medications    amoxicillin -clavulanate 875-125 MG tablet Commonly known as: AUGMENTIN Take 1 tablet by mouth 2 (two) times daily for 4 days. Start taking on: Feb 11, 2024   doxycycline  100 MG capsule Commonly known as: VIBRAMYCIN  Take 1 capsule (100 mg total) by mouth 2 (two) times daily for 5 days.   ibuprofen  600 MG tablet Commonly known as: ADVIL  Take 600 mg by mouth every 8 (eight) hours as needed.   lidocaine 5 % Commonly known as: LIDODERM Place 1 patch onto the skin daily. Remove & Discard patch within 12 hours or as directed by MD   methocarbamol  750 MG tablet Commonly known as: ROBAXIN  Take 1 tablet (750 mg total) by mouth 4 (four) times daily for 5 days. What changed:  medication strength how much to take when to take this        DISPOSITION AND FOLLOW-UP:  [name] [name] was discharged from [facility] in stable condition. At the hospital follow up visit please address:  Community-acquired pneumonia: Discharged on 4 days of Augmentin twice daily.  Follow-up on respiratory symptoms and resolution of pleuritic and back pain.  Muscle spasm, left side of the back: Likely secondary to pleuritic inflammation and coughing.  Discharged with short course of Robaxin .  Reassess outpatient.  Follow-up Appointments:   Follow-up Information     Jose Ngo, MD. Go on 02/17/2024.   Specialty: Internal Medicine Why: 02/17/2024 10:15 AM NEW ADDRESS: [address] [place], [place] [address] Contact information: [address] [place] [address] [phone]                HOSPITAL COURSE:  Patient Summary: Community-acquired pneumonia Patient presented to the emergency department on Sunday, 5/11, and was diagnosed with community-acquired pneumonia.  He was started on oral doxycycline  and discharged from the ED, however did not have any improvement, so he returned to the emergency department.  He had a fever around 102 at home, had pleuritic chest pain, and developed a cough.  On presentation, he had a fever of 101 and white blood cell count of 17.6.  He was satting 90% on room air with no subjective shortness of breath.  A chest x-ray showed a lower left lobe pneumonia with blunting of left costophrenic angle representative of a possible small pleural effusion.  CTA of the chest was done which confirmed the above findings and showed no evidence of pulmonary embolus.  It did demonstrate mild cardiomegaly, but the patient denies any cardiac history and euvolemic on exam. He was admitted for community-acquired pneumonia given failure of outpatient antibiotics.  He was started on Rocephin and azithromycin with improvement of his symptoms.  He was transition to 4 days of oral Augmentin and was given Robaxin  and lidocaine patches for his muscle spasms. He remains afebrile, hemodynamically stable, and  white count is improved with antibiotics. He feels ready for discharge with oral antibiotics and close outpatient follow up.   Muscle spasm, left side of back He had left-sided back pain on exam, likely muscle spasm secondary to his pleuritic inflammation and coughing. He was given Robaxin  with some improvement. Expect this to resolve as his infection clears.    DISCHARGE INSTRUCTIONS:   Discharge  Instructions     Call MD for:  difficulty breathing, headache or visual disturbances   Complete by: As directed    Call MD for:  extreme fatigue   Complete by: As directed    Call MD for:  hives   Complete by: As directed    Call MD for:  persistant dizziness or light-headedness   Complete by: As directed    Call MD for:  persistant nausea and vomiting   Complete by: As directed    Call MD for:  severe uncontrolled pain   Complete by: As directed    Discharge instructions   Complete by: As directed    You were hospitalized for Community acquired pneumonia.  You were treated with IV antibiotics and you will be discharged with a short course of oral antibiotics. Thank you for allowing us  to be part of your care.   We arranged for you to follow up at: [facility] on 5/21 at 10:15 am.   Please note these changes made to your medications:   Please START taking:   An antibiotic, Augmentin, twice daily for the next 4 days.  You can keep taking your doxycycline  antibiotic as well for the next 2 days.  I have prescribed you a muscle relaxer called Robaxin , which she can take up to 4 times daily for your back pain.  I have also prescribed lidocaine patches which you can apply once daily for pain relief.  I recommend picking up Excedrin over-the-counter, as it may be cheaper that way, to help with your headaches.  Please STOP taking:   Amoxicillin   Please make sure to follow-up with your primary care provider for further management of your health.  If you experience any worsening shortness of breath, continually worsening pain, confusion, lightheadedness or dizziness, or persistent fevers, I recommend returning to the emergency department for reevaluation.  Otherwise, expect your symptoms to resolve with a short course of oral antibiotics.  Please call our clinic if you have any questions or concerns, we may be able to help and keep you from a long and  expensive emergency room wait. Our clinic and after hours phone number is [phone], the best time to call is Monday through Friday 9 am to 4 pm but there is always someone available 24/7 if you have an emergency. If you need medication refills please notify your pharmacy one week in advance and they will send us  a request.   Increase activity slowly   Complete by: As directed        SUBJECTIVE:   A little better this morning, still has some back pain and pain with deep breathing.  Denies any respiratory symptoms.  Satting well on room air.   Discharge Vitals:   BP 126/68   Pulse 85   Temp 99.4 F (37.4 C) (Oral)   Resp 20   Ht 5' 7.99" (1.727 m)   Wt 74.8 kg   SpO2 99%   BMI 25.08 kg/m   OBJECTIVE:  Physical Exam Constitutional:      Appearance: Normal appearance.  HENT:  Head: Normocephalic and atraumatic.  Cardiovascular:     Rate and Rhythm: Normal rate and regular rhythm.     Pulses: Normal pulses.     Heart sounds: Normal heart sounds.  Pulmonary:     Effort: Pulmonary effort is normal. No respiratory distress.     Breath sounds: Rhonchi present.     Comments: Mild rhonchi in the left lower lobe Abdominal:     General: Abdomen is flat.  Musculoskeletal:        General: Tenderness present. Normal range of motion.     Comments: Tenderness to palpation over the middle of the left side of the back; muscle tightness present  Neurological:     General: No focal deficit present.     Mental Status: He is alert and oriented to person, place, and time.  Psychiatric:        Mood and Affect: Mood normal.        Behavior: Behavior normal.     Pertinent Labs, Studies, and Procedures:     Latest Ref Rng & Units 02/10/2024    6:44 AM 02/09/2024    2:24 PM 02/07/2024    2:44 AM  CBC  WBC 4.0 - 10.5 K/uL 12.9  17.6    Hemoglobin 13.0 - 17.0 g/dL 16.1  09.6  04.5   Hematocrit 39.0 - 52.0 % 35.2  36.9  41.0   Platelets 150 - 400 K/uL 188  172         Latest Ref  Rng & Units 02/10/2024    6:44 AM 02/09/2024    2:24 PM 02/07/2024    2:44 AM  CMP  Glucose 70 - 99 mg/dL 84  409  811   BUN 6 - 20 mg/dL 8  7  12    Creatinine 0.61 - 1.24 mg/dL 9.14  7.82  9.56   Sodium 135 - 145 mmol/L 137  136  139   Potassium 3.5 - 5.1 mmol/L 3.2  3.4  3.0   Chloride 98 - 111 mmol/L 109  104  105   CO2 22 - 32 mmol/L 20  24    Calcium 8.9 - 10.3 mg/dL 8.2  8.7      CT Angio Chest PE W and/or Wo Contrast Result Date: 02/09/2024 CLINICAL DATA:  Shortness of breath and chest pain, left lower lobe airspace opacity/pneumonia EXAM: CT ANGIOGRAPHY CHEST WITH CONTRAST TECHNIQUE: Multidetector CT imaging of the chest was performed using the standard protocol during bolus administration of intravenous contrast. Multiplanar CT image reconstructions and MIPs were obtained to evaluate the vascular anatomy. RADIATION DOSE REDUCTION: This exam was performed according to the departmental dose-optimization program which includes automated exposure control, adjustment of the mA and/or kV according to patient size and/or use of iterative reconstruction technique. CONTRAST:  75mL OMNIPAQUE IOHEXOL 350 MG/ML SOLN COMPARISON:  02/09/2024 chest radiograph FINDINGS: Cardiovascular: No filling defect is identified in the pulmonary arterial tree to suggest pulmonary embolus. Mild cardiomegaly. Mediastinum/Nodes: AP window lymph node 1.0 cm in short axis on image 46 series 5. Left hilar node 1.1 cm in short axis image 59 series 5. Subcarinal node 1.1 cm in short axis image 57 series 5. Left infrahilar node 1.1 cm in short axis image 68 series 5. Additional small mediastinal lymph nodes are present. Lungs/Pleura: Dense consolidation throughout much of the left lower lobe favoring bacterial pattern acute pneumonia. Mild paraseptal emphysema. Mild bilateral airway thickening which may reflect bronchitis or reactive airways disease. Mild scarring or atelectasis in the lingula.  Trace right pleural effusion. No  significant parapneumonic or left pleural effusion. Upper Abdomen: Unremarkable Musculoskeletal: Unremarkable Review of the MIP images confirms the above findings. IMPRESSION: 1. No filling defect is identified in the pulmonary arterial tree to suggest pulmonary embolus. 2. Dense consolidation throughout much of the left lower lobe favoring bacterial pattern acute pneumonia. 3. Mild mediastinal and left hilar/infrahilar adenopathy, likely reactive in this setting. 4. Mild bilateral airway thickening which may reflect bronchitis or reactive airways disease. 5. Minimal paraseptal emphysema. 6. Mild cardiomegaly. 7. Trace right pleural effusion. Electronically Signed   By: Freida Jes M.D.   On: 02/09/2024 17:54   DG Chest 2 View Result Date: 02/09/2024 CLINICAL DATA:  Shortness of breath EXAM: CHEST - 2 VIEW COMPARISON:  Chest radiograph dated 02/07/2024 FINDINGS: Increased left lower lobe consolidation. Blunting of the left costophrenic angle. No pneumothorax. The heart size and mediastinal contours are within normal limits. No acute osseous abnormality. IMPRESSION: 1. Increased left lower lobe pneumonia. 2. Blunting of the left costophrenic angle, which may represent a small pleural effusion. Electronically Signed   By: Limin  Xu M.D.   On: 02/09/2024 15:10     Signed: Dorthy Gavia, MD Internal Medicine Resident, PGY-1 [facility]  Pager: [phone] 2:01 PM, 02/10/2024

## 2024-02-10 NOTE — Hospital Course (Addendum)
 Community-acquired pneumonia Patient presented to the emergency department on Sunday, 5/11, and was diagnosed with community-acquired pneumonia.  He was started on oral doxycycline  and discharged from the ED, however did not have any improvement, so he returned to the emergency department.  He had a fever around 102 at home, had pleuritic chest pain, and developed a cough.  On presentation, he had a fever of 101 and white blood cell count of 17.6.  He was satting 90% on room air with no subjective shortness of breath.  A chest x-ray showed a lower left lobe pneumonia with blunting of left costophrenic angle representative of a possible small pleural effusion.  CTA of the chest was done which confirmed the above findings and showed no evidence of pulmonary embolus.  It did demonstrate mild cardiomegaly, but the patient denies any cardiac history and euvolemic on exam. He was admitted for community-acquired pneumonia given failure of outpatient antibiotics.  He was started on Rocephin and azithromycin with improvement of his symptoms.  He was transition to 4 days of oral Augmentin and was given Robaxin  and lidocaine patches for his muscle spasms. He remains afebrile, hemodynamically stable, and white count is improved with antibiotics. He feels ready for discharge with oral antibiotics and close outpatient follow up.   Muscle spasm, left side of back He had left-sided back pain on exam, likely muscle spasm secondary to his pleuritic inflammation and coughing. He was given Robaxin  with some improvement. Expect this to resolve as his infection clears.

## 2024-02-10 NOTE — Discharge Summary (Signed)
 Name: [name] [name] MRN: [id number] DOB: August 16, 1986 38 y.o. PCP: Patient, No Pcp Per  Date of Admission: 02/09/2024  2:16 PM Date of Discharge:  02/10/2024 Attending Physician: Dr. Bettejane Brownie  DISCHARGE DIAGNOSIS:  Primary Problem: Community acquired bacterial pneumonia   Hospital Problems: Principal Problem:   Community acquired bacterial pneumonia Active Problems:   Abdominal pain   Mild cardiomegaly    DISCHARGE MEDICATIONS:   Allergies as of 02/10/2024   No Known Allergies      Medication List     STOP taking these medications    amoxicillin  500 MG tablet Commonly known as: AMOXIL        TAKE these medications    amoxicillin -clavulanate 875-125 MG tablet Commonly known as: AUGMENTIN Take 1 tablet by mouth 2 (two) times daily for 4 days. Start taking on: Feb 11, 2024   doxycycline  100 MG capsule Commonly known as: VIBRAMYCIN  Take 1 capsule (100 mg total) by mouth 2 (two) times daily for 5 days.   ibuprofen  600 MG tablet Commonly known as: ADVIL  Take 600 mg by mouth every 8 (eight) hours as needed.   lidocaine 5 % Commonly known as: LIDODERM Place 1 patch onto the skin daily. Remove & Discard patch within 12 hours or as directed by MD   methocarbamol  750 MG tablet Commonly known as: ROBAXIN  Take 1 tablet (750 mg total) by mouth 4 (four) times daily for 5 days. What changed:  medication strength how much to take when to take this        DISPOSITION AND FOLLOW-UP:  Mr.Doyne Renzi was discharged from [facility] in stable condition. At the hospital follow up visit please address:  Community-acquired pneumonia: Discharged on 4 days of Augmentin twice daily.  Follow-up on respiratory symptoms and resolution of pleuritic and back pain.  Muscle spasm, left side of the back: Likely secondary to pleuritic inflammation and coughing.  Discharged with short course of Robaxin .  Reassess outpatient.  Follow-up Appointments:   Follow-up Information     Jose Ngo, MD. Go on 02/17/2024.   Specialty: Internal Medicine Why: 02/17/2024 10:15 AM NEW ADDRESS: [address] [place], [place] [address] Contact information: [address] [place] [address] [phone]                HOSPITAL COURSE:  Patient Summary: Community-acquired pneumonia Patient presented to the emergency department on Sunday, 5/11, and was diagnosed with community-acquired pneumonia.  He was started on oral doxycycline  and discharged from the ED, however did not have any improvement, so he returned to the emergency department.  He had a fever around 102 at home, had pleuritic chest pain, and developed a cough.  On presentation, he had a fever of 101 and white blood cell count of 17.6.  He was satting 90% on room air with no subjective shortness of breath.  A chest x-ray showed a lower left lobe pneumonia with blunting of left costophrenic angle representative of a possible small pleural effusion.  CTA of the chest was done which confirmed the above findings and showed no evidence of pulmonary embolus.  It did demonstrate mild cardiomegaly, but the patient denies any cardiac history and euvolemic on exam. He was admitted for community-acquired pneumonia given failure of outpatient antibiotics.  He was started on Rocephin and azithromycin with improvement of his symptoms.  He was transition to 4 days of oral Augmentin and was given Robaxin  and lidocaine patches for his muscle spasms. He remains afebrile, hemodynamically stable, and  white count is improved with antibiotics. He feels ready for discharge with oral antibiotics and close outpatient follow up.   Muscle spasm, left side of back He had left-sided back pain on exam, likely muscle spasm secondary to his pleuritic inflammation and coughing. He was given Robaxin  with some improvement. Expect this to resolve as his infection clears.    DISCHARGE INSTRUCTIONS:   Discharge  Instructions     Call MD for:  difficulty breathing, headache or visual disturbances   Complete by: As directed    Call MD for:  extreme fatigue   Complete by: As directed    Call MD for:  hives   Complete by: As directed    Call MD for:  persistant dizziness or light-headedness   Complete by: As directed    Call MD for:  persistant nausea and vomiting   Complete by: As directed    Call MD for:  severe uncontrolled pain   Complete by: As directed    Discharge instructions   Complete by: As directed    You were hospitalized for Community acquired pneumonia.  You were treated with IV antibiotics and you will be discharged with a short course of oral antibiotics. Thank you for allowing us  to be part of your care.   We arranged for you to follow up at: [facility] on 5/21 at 10:15 am.   Please note these changes made to your medications:   Please START taking:   An antibiotic, Augmentin, twice daily for the next 4 days.  You can keep taking your doxycycline  antibiotic as well for the next 2 days.  I have prescribed you a muscle relaxer called Robaxin , which she can take up to 4 times daily for your back pain.  I have also prescribed lidocaine patches which you can apply once daily for pain relief.  I recommend picking up Excedrin over-the-counter, as it may be cheaper that way, to help with your headaches.  Please STOP taking:   Amoxicillin   Please make sure to follow-up with your primary care provider for further management of your health.  If you experience any worsening shortness of breath, continually worsening pain, confusion, lightheadedness or dizziness, or persistent fevers, I recommend returning to the emergency department for reevaluation.  Otherwise, expect your symptoms to resolve with a short course of oral antibiotics.  Please call our clinic if you have any questions or concerns, we may be able to help and keep you from a long and  expensive emergency room wait. Our clinic and after hours phone number is [phone], the best time to call is Monday through Friday 9 am to 4 pm but there is always someone available 24/7 if you have an emergency. If you need medication refills please notify your pharmacy one week in advance and they will send us  a request.   Increase activity slowly   Complete by: As directed        SUBJECTIVE:   A little better this morning, still has some back pain and pain with deep breathing.  Denies any respiratory symptoms.  Satting well on room air.   Discharge Vitals:   BP 126/68   Pulse 85   Temp 99.4 F (37.4 C) (Oral)   Resp 20   Ht 5' 7.99" (1.727 m)   Wt 74.8 kg   SpO2 99%   BMI 25.08 kg/m   OBJECTIVE:  Physical Exam Constitutional:      Appearance: Normal appearance.  HENT:  Head: Normocephalic and atraumatic.  Cardiovascular:     Rate and Rhythm: Normal rate and regular rhythm.     Pulses: Normal pulses.     Heart sounds: Normal heart sounds.  Pulmonary:     Effort: Pulmonary effort is normal. No respiratory distress.     Breath sounds: Rhonchi present.     Comments: Mild rhonchi in the left lower lobe Abdominal:     General: Abdomen is flat.  Musculoskeletal:        General: Tenderness present. Normal range of motion.     Comments: Tenderness to palpation over the middle of the left side of the back; muscle tightness present  Neurological:     General: No focal deficit present.     Mental Status: He is alert and oriented to person, place, and time.  Psychiatric:        Mood and Affect: Mood normal.        Behavior: Behavior normal.     Pertinent Labs, Studies, and Procedures:     Latest Ref Rng & Units 02/10/2024    6:44 AM 02/09/2024    2:24 PM 02/07/2024    2:44 AM  CBC  WBC 4.0 - 10.5 K/uL 12.9  17.6    Hemoglobin 13.0 - 17.0 g/dL 16.1  09.6  04.5   Hematocrit 39.0 - 52.0 % 35.2  36.9  41.0   Platelets 150 - 400 K/uL 188  172         Latest Ref  Rng & Units 02/10/2024    6:44 AM 02/09/2024    2:24 PM 02/07/2024    2:44 AM  CMP  Glucose 70 - 99 mg/dL 84  409  811   BUN 6 - 20 mg/dL 8  7  12    Creatinine 0.61 - 1.24 mg/dL 9.14  7.82  9.56   Sodium 135 - 145 mmol/L 137  136  139   Potassium 3.5 - 5.1 mmol/L 3.2  3.4  3.0   Chloride 98 - 111 mmol/L 109  104  105   CO2 22 - 32 mmol/L 20  24    Calcium 8.9 - 10.3 mg/dL 8.2  8.7      CT Angio Chest PE W and/or Wo Contrast Result Date: 02/09/2024 CLINICAL DATA:  Shortness of breath and chest pain, left lower lobe airspace opacity/pneumonia EXAM: CT ANGIOGRAPHY CHEST WITH CONTRAST TECHNIQUE: Multidetector CT imaging of the chest was performed using the standard protocol during bolus administration of intravenous contrast. Multiplanar CT image reconstructions and MIPs were obtained to evaluate the vascular anatomy. RADIATION DOSE REDUCTION: This exam was performed according to the departmental dose-optimization program which includes automated exposure control, adjustment of the mA and/or kV according to patient size and/or use of iterative reconstruction technique. CONTRAST:  75mL OMNIPAQUE IOHEXOL 350 MG/ML SOLN COMPARISON:  02/09/2024 chest radiograph FINDINGS: Cardiovascular: No filling defect is identified in the pulmonary arterial tree to suggest pulmonary embolus. Mild cardiomegaly. Mediastinum/Nodes: AP window lymph node 1.0 cm in short axis on image 46 series 5. Left hilar node 1.1 cm in short axis image 59 series 5. Subcarinal node 1.1 cm in short axis image 57 series 5. Left infrahilar node 1.1 cm in short axis image 68 series 5. Additional small mediastinal lymph nodes are present. Lungs/Pleura: Dense consolidation throughout much of the left lower lobe favoring bacterial pattern acute pneumonia. Mild paraseptal emphysema. Mild bilateral airway thickening which may reflect bronchitis or reactive airways disease. Mild scarring or atelectasis in the lingula.  Trace right pleural effusion. No  significant parapneumonic or left pleural effusion. Upper Abdomen: Unremarkable Musculoskeletal: Unremarkable Review of the MIP images confirms the above findings. IMPRESSION: 1. No filling defect is identified in the pulmonary arterial tree to suggest pulmonary embolus. 2. Dense consolidation throughout much of the left lower lobe favoring bacterial pattern acute pneumonia. 3. Mild mediastinal and left hilar/infrahilar adenopathy, likely reactive in this setting. 4. Mild bilateral airway thickening which may reflect bronchitis or reactive airways disease. 5. Minimal paraseptal emphysema. 6. Mild cardiomegaly. 7. Trace right pleural effusion. Electronically Signed   By: Freida Jes M.D.   On: 02/09/2024 17:54   DG Chest 2 View Result Date: 02/09/2024 CLINICAL DATA:  Shortness of breath EXAM: CHEST - 2 VIEW COMPARISON:  Chest radiograph dated 02/07/2024 FINDINGS: Increased left lower lobe consolidation. Blunting of the left costophrenic angle. No pneumothorax. The heart size and mediastinal contours are within normal limits. No acute osseous abnormality. IMPRESSION: 1. Increased left lower lobe pneumonia. 2. Blunting of the left costophrenic angle, which may represent a small pleural effusion. Electronically Signed   By: Limin  Xu M.D.   On: 02/09/2024 15:10     Signed: Dorthy Gavia, MD Internal Medicine Resident, PGY-1 [facility]  Pager: [phone] 2:01 PM, 02/10/2024

## 2024-02-12 LAB — CULTURE, BLOOD (ROUTINE X 2)
Culture: NO GROWTH
Culture: NO GROWTH
Special Requests: ADEQUATE

## 2024-02-14 LAB — CULTURE, BLOOD (ROUTINE X 2)
Culture: NO GROWTH
Culture: NO GROWTH
Special Requests: ADEQUATE
Special Requests: ADEQUATE

## 2024-02-17 ENCOUNTER — Encounter: Payer: Self-pay | Admitting: Student
